# Patient Record
Sex: Male | Born: 1955 | Race: White | Hispanic: No | Marital: Married | Smoking: Former smoker
Health system: Southern US, Community
[De-identification: ages and names within clinical notes are randomized; demographics above are authoritative.]

## PROBLEM LIST (undated history)

## (undated) DIAGNOSIS — IMO0001 Reserved for inherently not codable concepts without codable children: Secondary | ICD-10-CM

## (undated) DIAGNOSIS — Z720 Tobacco use: Secondary | ICD-10-CM

## (undated) DIAGNOSIS — E7800 Pure hypercholesterolemia, unspecified: Secondary | ICD-10-CM

## (undated) DIAGNOSIS — Z8249 Family history of ischemic heart disease and other diseases of the circulatory system: Secondary | ICD-10-CM

## (undated) DIAGNOSIS — I10 Essential (primary) hypertension: Secondary | ICD-10-CM

## (undated) DIAGNOSIS — I2510 Atherosclerotic heart disease of native coronary artery without angina pectoris: Secondary | ICD-10-CM

## (undated) HISTORY — PX: CARDIAC CATHETERIZATION: SHX172

## (undated) HISTORY — DX: Reserved for inherently not codable concepts without codable children: IMO0001

## (undated) HISTORY — DX: Atherosclerotic heart disease of native coronary artery without angina pectoris: I25.10

## (undated) HISTORY — PX: APPENDECTOMY: SHX54

## (undated) HISTORY — DX: Family history of ischemic heart disease and other diseases of the circulatory system: Z82.49

## (undated) HISTORY — PX: ROTATOR CUFF REPAIR: SHX139

## (undated) HISTORY — DX: Tobacco use: Z72.0

## (undated) HISTORY — DX: Pure hypercholesterolemia, unspecified: E78.00

## (undated) HISTORY — DX: Essential (primary) hypertension: I10

## (undated) HISTORY — PX: THYROIDECTOMY: SHX17

---

## 1997-08-26 ENCOUNTER — Ambulatory Visit (HOSPITAL_COMMUNITY): Admission: RE | Admit: 1997-08-26 | Discharge: 1997-08-26 | Payer: Self-pay | Admitting: Neurosurgery

## 2002-07-03 ENCOUNTER — Observation Stay (HOSPITAL_COMMUNITY): Admission: EM | Admit: 2002-07-03 | Discharge: 2002-07-04 | Payer: Self-pay

## 2002-07-03 ENCOUNTER — Encounter: Payer: Self-pay | Admitting: Emergency Medicine

## 2003-10-30 ENCOUNTER — Inpatient Hospital Stay (HOSPITAL_COMMUNITY): Admission: EM | Admit: 2003-10-30 | Discharge: 2003-11-01 | Payer: Self-pay | Admitting: Emergency Medicine

## 2003-10-31 ENCOUNTER — Encounter (INDEPENDENT_AMBULATORY_CARE_PROVIDER_SITE_OTHER): Payer: Self-pay | Admitting: Specialist

## 2003-11-14 ENCOUNTER — Ambulatory Visit (HOSPITAL_COMMUNITY): Admission: RE | Admit: 2003-11-14 | Discharge: 2003-11-14 | Payer: Self-pay | Admitting: Cardiology

## 2005-02-03 IMAGING — CT CT PELVIS W/ CM
1 of 2 series · 15 of 32 positions shown, 19 images · IV contrast (GASTRO & 150 CC OMNI 300)
Comparison: none

CLINICAL DATA: weakness; abdominal pain
 CT SCAN OF THE ABDOMEN WITH CONTRAST
 Spiral scanning is performed after oral administration of diluted contrast and during intravenous administration of 055cc of Omnipaque 300.
 Lung bases are clear.  The liver contains a 1cm cyst at the dome.  There is an indeterminate 1cm low density in the caudal tip of the right lobe.  The gallbladder does not contain any calcified stones.  The spleen and pancreas are normal.  The adrenal glands are normal.  The kidneys are normal.  No retroperitoneal mass or adenopathy.  No aortic aneurysm.  One can appreciate marked thickening of the wall of the transverse colon and splenic flexure.  Segmental involvement of the colon could be seen in ishemic colitis, infectious colitis or inflammatory bowel disease.  The findings are not particularly suggestive of diverticulitis.  
 IMPRESSION
 Segmental thickening of the colon wall from the distal transverse colon to the proximal descending colon.  No sign of gross perforation.  The findings could be due to ischemic, infectious or inflammatory colitis.
 Indeterminate 1cm low density in the caudal segment of the right lobe of the liver.  
 CT SCAN OF THE PELVIS WITH CONTRAST
 Spiral scanning is performed after oral and intravenous contrast administration.  
 No free fluid in the pelvis and no sign of small or large bowel pathology in that region.  The bladder, prostate gland and seminal vesicles appear unremarkable.  No mass or adenopathy.  
 Negative CT scan of the pelvis.

[Series 2: routine abdomen · axial · 0.66mm/px · z∈[-430,-24]mm · 15 of 122 slices shown, 19 images]
[im 9/122  soft-tissue]
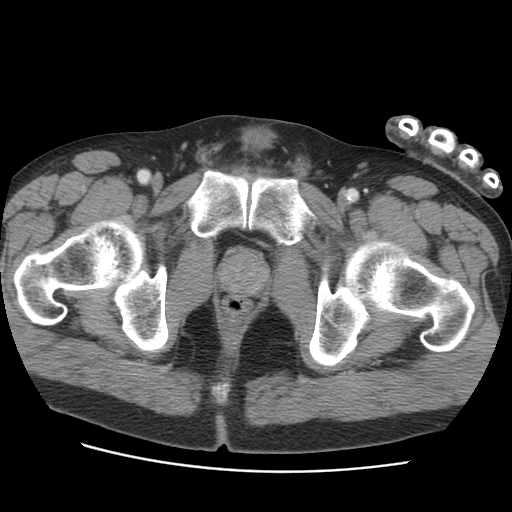
[im 9/122  bone]
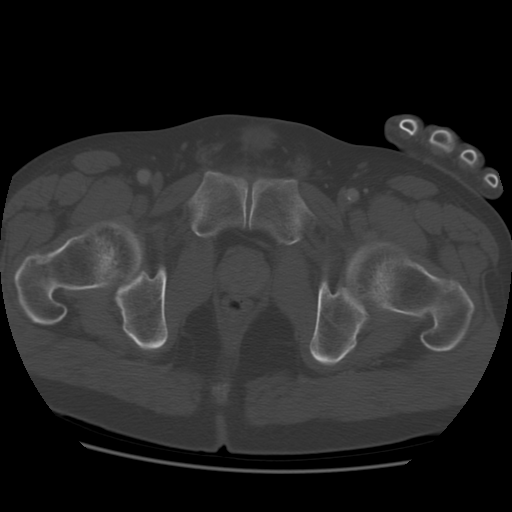
[im 17/122  soft-tissue]
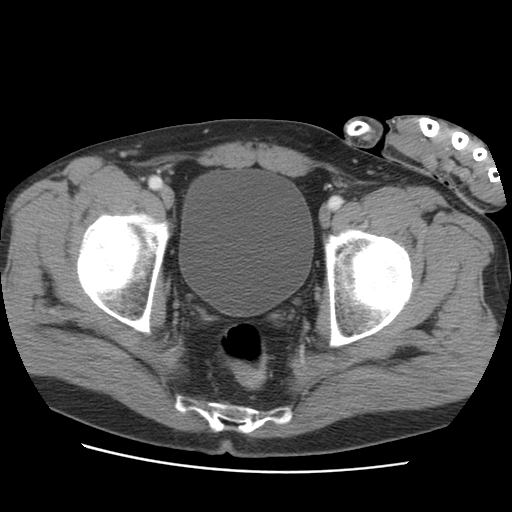
[im 26/122  soft-tissue]
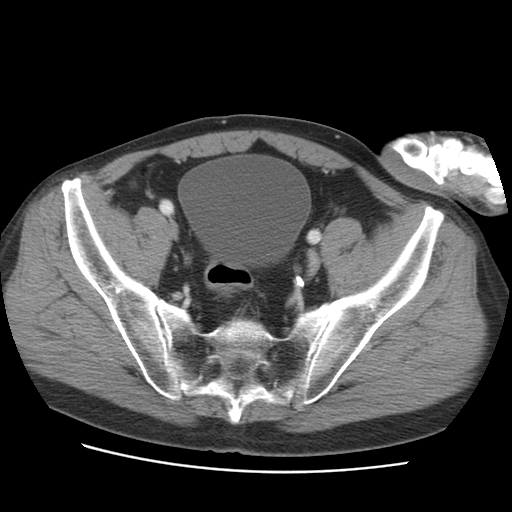
[im 34/122  soft-tissue]
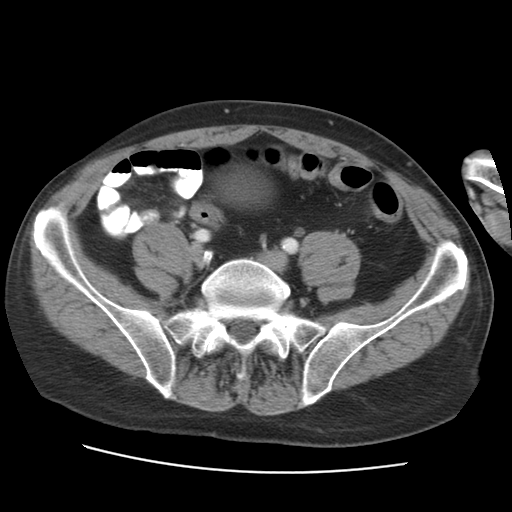
[im 42/122  soft-tissue]
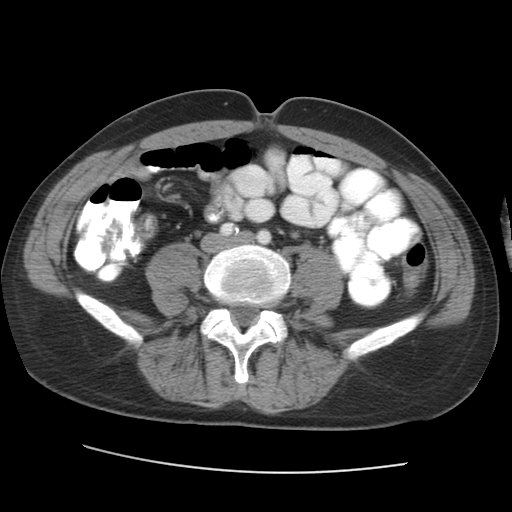
[im 51/122  soft-tissue]
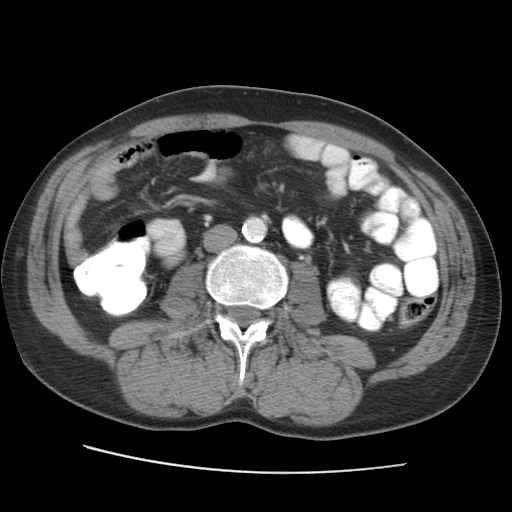
[im 63/122  soft-tissue]
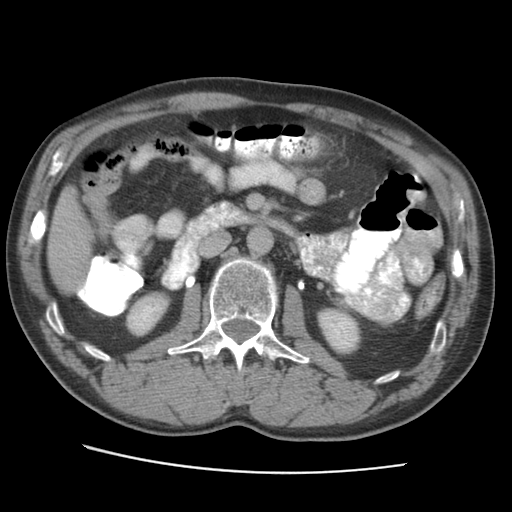
[im 71/122  soft-tissue]
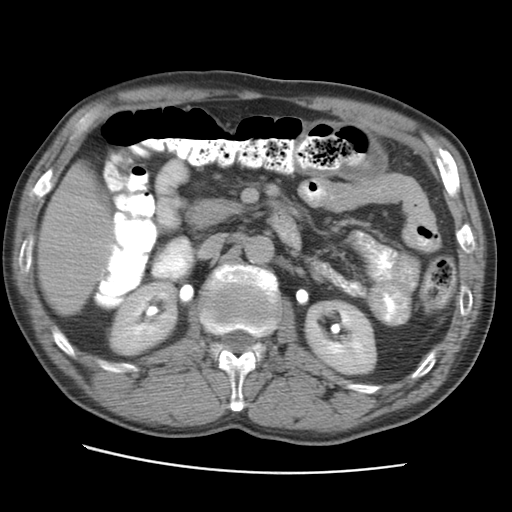
[im 80/122  soft-tissue]
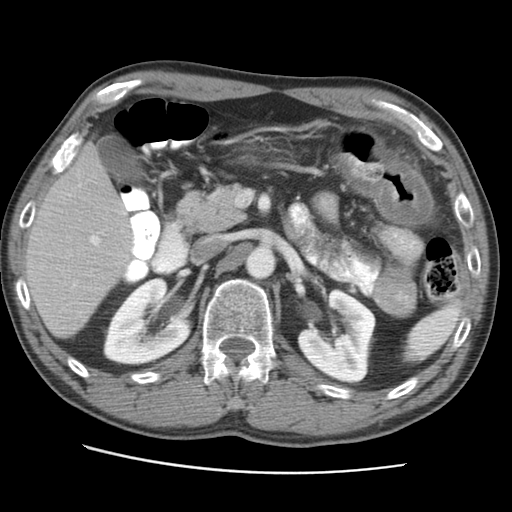
[im 80/122  bone]
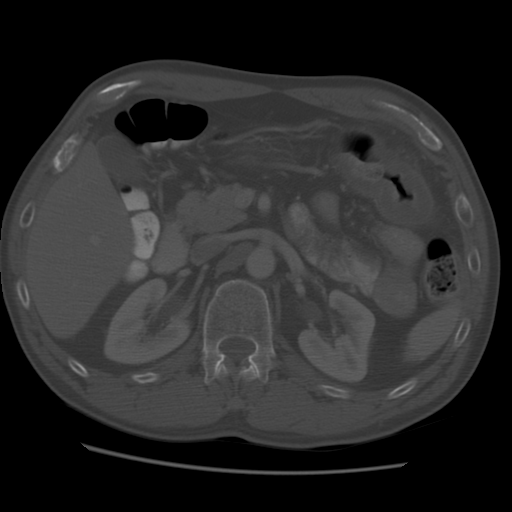
[im 88/122  soft-tissue]
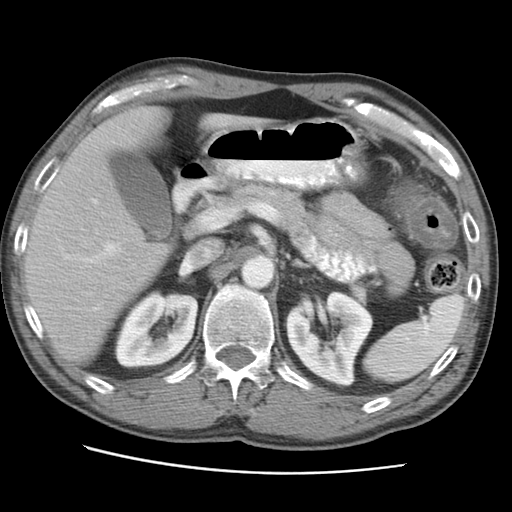
[im 96/122  soft-tissue]
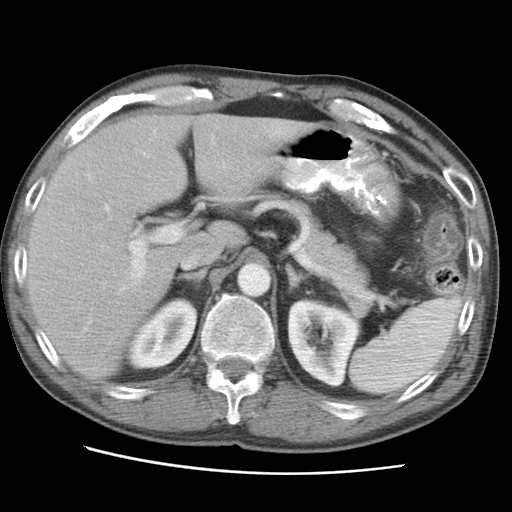
[im 105/122  soft-tissue]
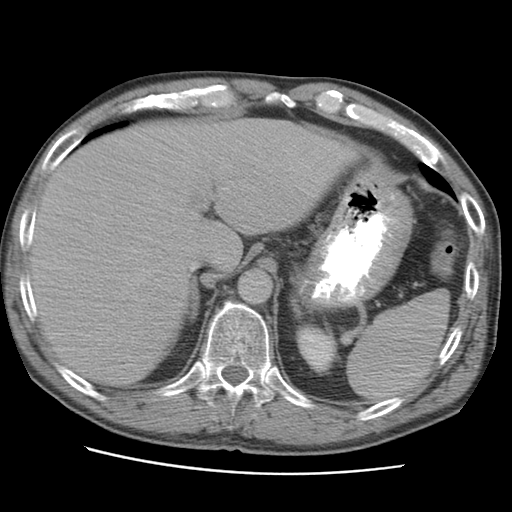
[im 105/122  lung]
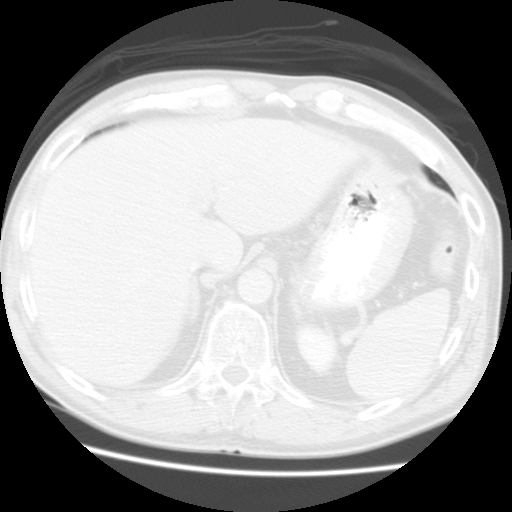
[im 109/122  lung]
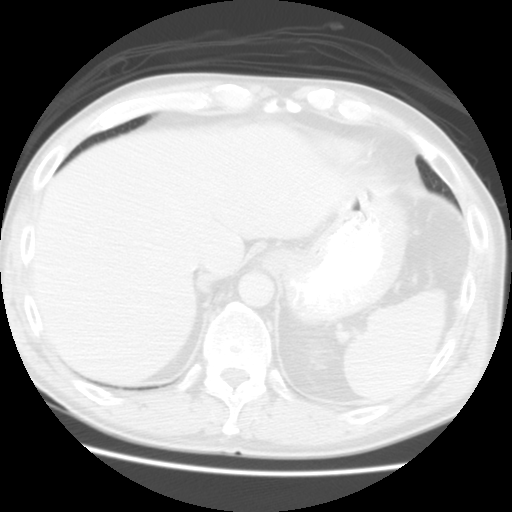
[im 113/122  soft-tissue]
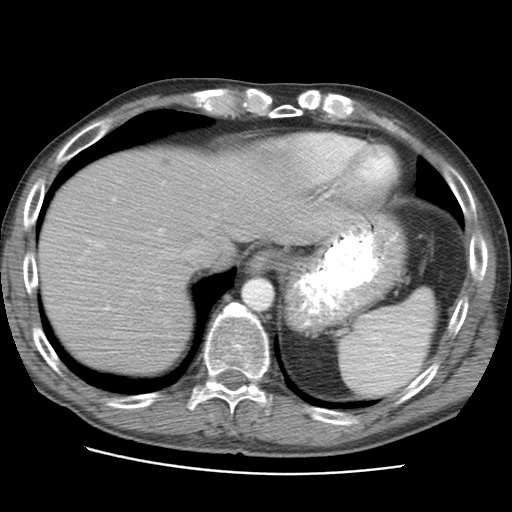
[im 113/122  lung]
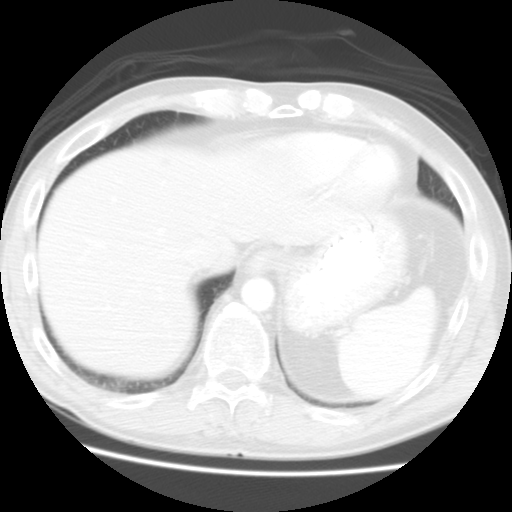
[im 117/122  lung]
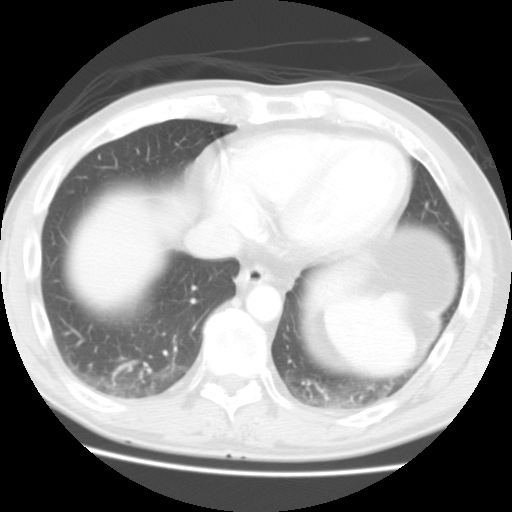

[15 of 32 positions shown; findings below may reference images not displayed]

## 2005-04-04 DIAGNOSIS — IMO0001 Reserved for inherently not codable concepts without codable children: Secondary | ICD-10-CM

## 2005-04-04 HISTORY — DX: Reserved for inherently not codable concepts without codable children: IMO0001

## 2009-12-23 ENCOUNTER — Ambulatory Visit: Payer: Self-pay | Admitting: Cardiology

## 2009-12-23 LAB — LIPID PANEL
Cholesterol: 158 mg/dL (ref 0–200)
HDL: 63 mg/dL (ref 35–70)

## 2009-12-23 LAB — BASIC METABOLIC PANEL
Glucose: 105 mg/dL
Potassium: 5.1 mmol/L (ref 3.4–5.3)

## 2010-05-04 ENCOUNTER — Encounter: Payer: Self-pay | Admitting: Cardiology

## 2010-05-04 DIAGNOSIS — E7800 Pure hypercholesterolemia, unspecified: Secondary | ICD-10-CM | POA: Insufficient documentation

## 2010-05-04 DIAGNOSIS — I10 Essential (primary) hypertension: Secondary | ICD-10-CM | POA: Insufficient documentation

## 2010-05-04 DIAGNOSIS — Z8249 Family history of ischemic heart disease and other diseases of the circulatory system: Secondary | ICD-10-CM | POA: Insufficient documentation

## 2010-05-04 DIAGNOSIS — Z720 Tobacco use: Secondary | ICD-10-CM | POA: Insufficient documentation

## 2010-06-23 ENCOUNTER — Telehealth: Payer: Self-pay | Admitting: *Deleted

## 2010-06-23 ENCOUNTER — Ambulatory Visit: Payer: Self-pay | Admitting: Cardiology

## 2010-06-23 NOTE — Telephone Encounter (Signed)
RN called and left pt voicemail to call back and reschedule missed OV and labs.

## 2010-07-06 ENCOUNTER — Ambulatory Visit (INDEPENDENT_AMBULATORY_CARE_PROVIDER_SITE_OTHER): Payer: BC Managed Care – PPO | Admitting: Cardiology

## 2010-07-06 ENCOUNTER — Encounter: Payer: Self-pay | Admitting: Cardiology

## 2010-07-06 VITALS — BP 128/80 | HR 70 | Ht 72.0 in | Wt 177.4 lb

## 2010-07-06 DIAGNOSIS — Z720 Tobacco use: Secondary | ICD-10-CM

## 2010-07-06 DIAGNOSIS — F17200 Nicotine dependence, unspecified, uncomplicated: Secondary | ICD-10-CM

## 2010-07-06 DIAGNOSIS — E7800 Pure hypercholesterolemia, unspecified: Secondary | ICD-10-CM

## 2010-07-06 DIAGNOSIS — I10 Essential (primary) hypertension: Secondary | ICD-10-CM

## 2010-07-06 DIAGNOSIS — Z8249 Family history of ischemic heart disease and other diseases of the circulatory system: Secondary | ICD-10-CM

## 2010-07-06 NOTE — Assessment & Plan Note (Signed)
Is currently well controlled on Crestor 10 mg per day. Samples were given

## 2010-07-06 NOTE — Progress Notes (Signed)
Samuel Wall comes in today for a followup visit. His hypertension has been satisfactory control but overall he's tolerating Crestor for control of his hypercholesterolemia. Unfortunately, he he continues to smoke cigarettes. He does have a strongly positive family history of heart disease. He had Cardiac catheterization in 1999 with ruptured plaque without recurrence. His last stress study was in 2007. He continues to be active otherwise.   Current Outpatient Prescriptions  Medication Sig Dispense Refill  . aspirin 81 MG tablet Take 81 mg by mouth daily.        . Ranitidine HCl (ZANTAC PO) Take 150 mg by mouth daily.       . rosuvastatin (CRESTOR) 5 MG tablet Take 10 mg by mouth daily.       . valsartan (DIOVAN) 80 MG tablet Take 160 mg by mouth daily.         Allergies  Allergen Reactions  . Valium Other (See Comments)    STOPS HEART!    Patient Active Problem List  Diagnoses  . HTN (hypertension)  . Hypercholesteremia  . Tobacco abuse  . Family history of ischemic heart disease    History  Smoking status  . Current Everyday Smoker -- 1.0 packs/day  . Types: Cigarettes  Smokeless tobacco  . Not on file    History  Alcohol Use     Family History  Problem Relation Age of Onset  . Coronary artery disease Father 49    CABG  . Hypertension Father     Review of Systems:   The patient denies any heat or cold intolerance.  No weight gain or weight loss.  The patient denies headaches or blurry vision.  There is no cough or sputum production.  The patient denies dizziness.  There is no hematuria or hematochezia.  The patient denies any muscle aches or arthritis.  The patient denies any rash.  The patient denies frequent falling or instability.  There is no history of depression or anxiety.  All other systems were reviewed and are negative.   Physical Exam: Weight is 177. Blood pressure is 120/80 sitting, heart rate 70.  The head is normocephalic and atraumatic.  Pupils are equally  round and reactive to light.  Sclerae nonicteric.  Conjunctiva is clear.  Oropharynx is unremarkable.  There's adequate oral airway.  Neck is supple there are no masses.  Thyroid is not enlarged.  There is no lymphadenopathy.  Lungs are clear.  Chest is symmetric.  Heart shows a regular rate and rhythm.  S1 and S2 are normal.  There is no murmur click or gallop.  Abdomen is soft normal bowel sounds.  There is no organomegaly.  Genital and rectal deferred.  Extremities are without edema.  Peripheral pulses are adequate.  Neurologically intact.  Full range of motion.  The patient is not depressed.  Skin is warm and dry.  Assessment / Plan:

## 2010-07-06 NOTE — Assessment & Plan Note (Signed)
Blood pressure is well-controlled. New prescriptions are written.

## 2010-07-06 NOTE — Assessment & Plan Note (Signed)
He continues to smoke. I encouraged him to stop.

## 2010-07-06 NOTE — Assessment & Plan Note (Signed)
He is a strong positive family history of heart disease and had what almost certainly was a plaque rupture in 1999. He continues to smoke away been able to manage other cardiovascular risk factors.

## 2010-07-07 LAB — COMPREHENSIVE METABOLIC PANEL
AST: 17 U/L (ref 0–37)
BUN: 19 mg/dL (ref 6–23)
Calcium: 9.4 mg/dL (ref 8.4–10.5)
Chloride: 103 mEq/L (ref 96–112)
Creatinine, Ser: 1.3 mg/dL (ref 0.4–1.5)

## 2010-07-07 LAB — LIPID PANEL
Cholesterol: 155 mg/dL (ref 0–200)
HDL: 50.1 mg/dL (ref >39.00)
Total CHOL/HDL Ratio: 3
Triglycerides: 91 mg/dL (ref 0.0–149.0)

## 2010-07-08 ENCOUNTER — Telehealth: Payer: Self-pay | Admitting: *Deleted

## 2010-07-12 ENCOUNTER — Telehealth: Payer: Self-pay | Admitting: *Deleted

## 2010-07-12 NOTE — Telephone Encounter (Signed)
Pt notified of lab results and to watch diet, exercise regularly, and lose weight.  Pt will continue same medications.

## 2010-07-12 NOTE — Telephone Encounter (Signed)
Message copied by Barnetta Hammersmith on Mon Jul 12, 2010 11:19 AM ------      Message from: Norma Fredrickson      Created: Wed Jul 07, 2010  9:10 AM       Ok to report. Labs are satisfactory. Watch your sugars. Diet/Exercise/Weight loss is emphasized.       Stay on same medicines. Recheck BMET/HPF/LIPIDS  in 6 months.

## 2010-07-14 NOTE — Telephone Encounter (Signed)
ERROR

## 2010-07-28 ENCOUNTER — Other Ambulatory Visit: Payer: Self-pay | Admitting: *Deleted

## 2010-07-28 DIAGNOSIS — I10 Essential (primary) hypertension: Secondary | ICD-10-CM

## 2010-07-28 MED ORDER — VALSARTAN 160 MG PO TABS: 160.0000 mg | ORAL_TABLET | Freq: Every day | ORAL | Status: DC

## 2010-08-20 NOTE — Discharge Summary (Signed)
Samuel Wall, Samuel Wall                        ACCOUNT NO.:  [id number]   MEDICAL RECORD NO.:  [id number]                   PATIENT TYPE:  INP   LOCATION:  3734                                 FACILITY:  [facility]   PHYSICIAN:  Sherin Quarry, MD                   DATE OF BIRTH:  April 27, 1955   DATE OF ADMISSION:  10/29/2003  DATE OF DISCHARGE:  11/01/2003                                 DISCHARGE SUMMARY   HISTORY:  Samuel Wall is a 55 year old man with a past history of  ischemic heart disease and cigarette smoking.  He initially presented on  October 30, 2003 with a one-day history of abdominal pain associated with  generalized cramping.  He described the pain as extremely severe.  Subsequently he developed diarrhea associated with hematochezia.  He denied  any previous history of abdominal complaints and indicated that he had not  had a colon examination in the past.  A CT scan of the abdomen was done in  the emergency room, which showed segmental thickening of the colon from the  distal transverse colon to the proximal descending colon  -- with no signs  of gross perforation.  These findings were felt to be compatible with  ischemic, infectious or inflammatory colitis.   Other relevant studies obtained in the emergency room included a normal  serum amylase, a normal lipase, a CMET which was normal; a CBC, which  revealed a white count of 11,800 and a normal urinalysis.   In light of these findings, it was felt prudent to admit the patient to the  hospital for further evaluation.  The patient was admitted by Dr. Hollice Espy, M.D.   PHYSICAL EXAMINATION:  On physical examination Dr. Rito Ehrlich noted:  VITAL SIGNS:  A temperature of 98.1, blood pressure 145/95.  Heart rate was  70, respirations 18, O2 saturations 100%.  HEENT:  Examination was within normal limits.  CHEST:  Clear to auscultation and percussion.  CARDIOVASCULAR:  Revealed normal S1, S2; no murmurs, rubs or  gallops.  ABDOMEN:  Described as having normal bowel sounds; it was not distended.  It  showed tenderness to palpation in the right and left lower quadrants;  without guarding or rebound.   HOSPITAL COURSE:  On admission, the patient was placed on an IV of D5 normal  saline at 85 cc/hr.  Imodium was ordered p.r.n. for diarrhea.  The patient  was initially made NPO.  He was empirically placed on Cipro and Flagyl --  the first dose of 400 mg b.i.d., the second dose of 500 mg t.i.d.   An attempt was made to obtain stool specimens, but there are no reports on  these studies to date.  Consultation was obtained from Dr. Ewing Schlein of the  [facility].  A colonoscopy was performed.  Dr. Marlane Hatcher note  indicates that the patient had inflammation of the splenic flexure of  the  colon, which was felt to probably be on an ischemic basis.  He showed no  other abnormalities.   Biopsies were obtained and were pending.  Dr. Ewing Schlein indicated that the  pathology reports would be back by Wednesday or Thursday of next week.  He  recommended that the patient be advanced to a regular diet.  He suggested  empirically continuing antibiotics for another five days, if stool cultures  did not suggest any specific pathogen.  He indicated in his note that an MRA  or CT-A would be a consideration; but, he would suggest waiting for a second  attack before doing that.  He recommended continuing once a day aspirin and  encouraged the patient to have a colonoscopy every five years.   On November 01, 2003 the patient seemed to be doing much better; therefore, was  discharged at that time.   DISCHARGE DIAGNOSES:  1. Generalized inflammation of the splenic flexure of the colon; probably on     ischemic basis.  2. History of coronary artery disease.  3. Hyperlipidemia.   DISCHARGE MEDICATIONS:  1. Diovan one tablet daily.  2. Aspirin 81 mg daily.  3. Zetia 10 mg q.d.  The patient will also be given Cipro 500 mg  b.i.d. for five additional days,  and Flagyl 500 mg t.i.d. for five additional days.   DISCHARGE INSTRUCTIONS:  The patient was counseled not to drink alcohol  while taking Flagyl.  He will continue to follow up on a regular basis with  Dr. Roger Shelter, and Dr. Ewing Schlein will inform him of the results of the  biopsy when this is available.                                                Sherin Quarry, MD    SY/MEDQ  D:  11/01/2003  T:  11/01/2003  Job:  [id number]   cc:   Colleen Can. Deborah Chalk, M.D.  Fax: [phone]   Petra Kuba, M.D.  [address]  [place]  [place] [address]  Fax: [phone]

## 2010-08-20 NOTE — Discharge Summary (Signed)
   NAME:  Samuel Wall, Samuel Wall                        ACCOUNT NO.:  [id number]   MEDICAL RECORD NO.:  [id number]                   PATIENT TYPE:  INP   LOCATION:  4732                                 FACILITY:  [facility]   PHYSICIAN:  Colleen Can. Deborah Chalk, M.D.            DATE OF BIRTH:  03-Apr-1956   DATE OF ADMISSION:  07/03/2002  DATE OF DISCHARGE:  07/04/2002                                 DISCHARGE SUMMARY   PRIMARY DISCHARGE DIAGNOSIS:  Presyncope with subsequent bradycardia,  currently resolved.   SECONDARY DISCHARGE DIAGNOSES:  1. Ischemic heart disease with previous ruptured plaque noted in March of     1999 per cardiac catheterization with moderate irregularities of     coronaries note.  2. Hyperlipidemia, currently off of Lipitor due to myalgias.  3. Past tobacco abuse, currently resolved.   HISTORY OF PRESENT ILLNESS:  The patient is a 55 year old white male who has  known ischemic heart disease.  He presented to the emergency room department  with bradycardia.  He basically did not feel well most of the part of the  day on Wednesday.  He was noted to have significant pallor and  lightheadedness and weakness.  There was some nausea.  He has no chest pain.  He was noted to have a heart rate of 40 by his wife.  He was subsequently  seen, evaluated and admitted for overnight observation.   Please see dictated history and physical for further patient presentation  and profile.   LABORATORY DATA:  Cardiac enzymes were all normal.  CBC normal.  Glucose  slightly elevated at 134, otherwise, chemistries were normal.   Chest x-ray was normal.   EKG showed no acute changes.   HOSPITAL COURSE:  The patient was admitted.  He was monitored overnight on  telemetry.  Heart rate has ranged from anywhere from 52 to 76.  No  significant bradycardia has been identified.  His Cardura has been  discontinued.  He is currently feeling better this morning and is felt to be  a stable candidate for  discharge with outpatient evaluation.   DISCHARGE CONDITION:  Stable.   DISCHARGE MEDICATIONS:  He will stop the Cardura.  He is to continue with  aspirin daily.  We will keep him off Lipitor for now.  We will add Altace  2.5 mg a day to his medical regimen.   SPECIAL DISCHARGE INSTRUCTIONS:  Our office will be calling him to arrange  stress testing time.  He is to call if problems develop in the interim.     Juanell Fairly C. Earl Gala, N.P.                 Colleen Can. Deborah Chalk, M.D.   LCO/MEDQ  D:  07/04/2002  T:  07/05/2002  Job:  [id number]

## 2010-08-20 NOTE — H&P (Signed)
NAME:  Samuel Wall, Samuel Wall                        ACCOUNT NO.:  [id number]   MEDICAL RECORD NO.:  [id number]                   PATIENT TYPE:  INP   LOCATION:  1825                                 FACILITY:  [facility]   PHYSICIAN:  Davene Costain                       DATE OF BIRTH:  Aug 18, 1955   DATE OF ADMISSION:  07/03/2002  DATE OF DISCHARGE:                                HISTORY & PHYSICAL   CHIEF COMPLAINT:  Weakness with presyncope.   HISTORY OF PRESENT ILLNESS:  Mr. Samuel Wall is a 55 year old white male who  has nonischemic heart disease who presents to the emergency department today  with bradycardia.  He notes that since December he had had severe aching of  his muscles.  His stopped his Lipitor over the past 10 days and has almost  had complete resolution of that problem.  He was previously doing well and  in his normal state of health until today when he awoke with a generalized  feeling of weakness which he describes as a sugar attack.  He has had this  in the past, and it subsequently resolved without intervention.  He was able  to go off to work; however, his family members there noted that he was pale,  somewhat gray looking.  He subsequently presented to the emergency room.  His heart rate was in the low 40's.  He has had some nausea in association,  but no chest pain.  He was subsequently admitted for further evaluation.   PAST MEDICAL HISTORY:  1. Ischemic heart disease.  He had a previous cardiac catheterization in     3/99 with normal LV function.  He was felt to have had a probable     ruptured plaque with moderate irregularities noted.  His last nuclear     study was in 2002.  2. Past tobacco abuse.  3. Hyperlipidemia, currently intolerant to Lipitor.   ALLERGIES:  None.   CURRENT MEDICATIONS:  1. Aspirin.  2. He has been off Lipitor for 10 days.   FAMILY HISTORY:  Father has had a history of bypass surgery.  Mother has  arthritis.   SOCIAL HISTORY:  He is  going to be married in August.  He has had no tobacco  products since December.   REVIEW OF SYSTEMS:  As otherwise noted.   PHYSICAL EXAMINATION:  VITAL SIGNS:  Blood pressure is currently 153/96,  heart rate 66, respirations 18.  __________  :  Unremarkable.  LUNGS:  Clear.  HEART:  Regular rhythm.  ABDOMEN:  Soft, positive bowel sounds.  EXTREMITIES:  Without edema.  NEUROLOGIC:  Intact.   LABORATORY DATA:  CBC is normal.  Chemistries are normal.  Glucose is  slightly elevated at 134.  CK and troponin are negative.  EKG shows sinus  bradycardia.   IMPRESSION:  1. Symptomatic bradycardia.  2. Known history of  ischemic heart disease.  3. Hyperlipidemia intolerant to Lipitor.   PLAN:  Will stop his Cardura.  Will watch him overnight on telemetry.  Recheck labs and his old EKG in the morning.     Samuel Wall C. Samuel Wall, N.P.                 Davene Costain    LCO/MEDQ  D:  07/03/2002  T:  07/04/2002  Job:  [id number]   cc:   Davene Costain  [address]  [place]  [place] [address]  Fax: [phone]

## 2010-08-20 NOTE — Consult Note (Signed)
NAME:  Samuel Wall, Samuel Wall                        ACCOUNT NO.:  [id number]   MEDICAL RECORD NO.:  [id number]                   PATIENT TYPE:  INP   LOCATION:  3734                                 FACILITY:  [facility]   PHYSICIAN:  Petra Kuba, M.D.                 DATE OF BIRTH:  [birth year]   DATE OF CONSULTATION:  10/31/2003  DATE OF DISCHARGE:                                   CONSULTATION   HISTORY:  The patient is seen at the request of Dr. Tresa Endo for abnormal CAT  scan, bloody diarrhea, abdominal pain that all started late Tuesday night  and when it continued to get bad on Wednesday, presented to the emergency  room.  He had a CAT scan of his abdomen which showed transverse and  descending colitis and was admitted for further work up and plans.  He was  recently on a cruise about 2 weeks ago, went to the [place], and nobody  else in his group became sick.  He has not been on any antibiotics.  He has  had no previous GI problems.  Bleeding has seemed to stop and his pain seems  to be better.  He has been working quite hard of late but no real strenuous  exercise lately.  He has not had any previous colon tests.   PAST MEDICAL HISTORY:  Pertinent for coronary artery disease and ruptured  plaque, and an myocardial infarction in 1998, and an episode of bradycardia  in 2004.   SOCIAL HISTORY:  He does drink, and does smoke.   MEDICATIONS AT HOME:  Include Diovan, aspirin and Zetia.   ALLERGIES:  VALIUM which was more of increased sedation but no rash,  wheezing or itching.   FAMILY HISTORY:  Negative for any GI problems.   REVIEW OF SYSTEMS:  Negative except above.   PHYSICAL EXAMINATION:  VITAL SIGNS:  See chart, afebrile.  GENERAL:  No acute distress.  ABDOMEN:  Pertinent for being soft and nontender, good bowel sounds.   LABORATORY DATA:  Pertinent for normal chemistries including BUN, creatinine  and liver tests. Amylase, lipase normal.  Coags normal.  White count of  11,000, hemoglobin 16.5, MCV 95, platelets 315,000.  CT scan pertinent for  the colitis as mentioned above but no other abnormalities.   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   PLAN:  Agree with stool studies as ordered although not back in the  computer.  The risks, benefits, methods of flexible  sigmoidoscopy/colonoscopy were scheduled with both the patient and his wife.  Will go ahead and give him half a bottle of magnesium citrate now since he  is better, and go ahead and proceed later today with further work up  and plans pending those findings.  We discussed ischemic colitis, how it can  occur as well as the procedure extensively with both patient and his wife.  Thank you very much for the consultation.                                               Petra Kuba, M.D.    MEM/MEDQ  D:  10/31/2003  T:  10/31/2003  Job:  [id number]

## 2010-08-20 NOTE — H&P (Signed)
NAME:  Samuel Wall, Samuel Wall                        ACCOUNT NO.:  [id number]   MEDICAL RECORD NO.:  [id number]                   PATIENT TYPE:  EMS   LOCATION:  [facility]                                 FACILITY:  [facility]   PHYSICIAN:  Hollice Espy, M.D.            DATE OF BIRTH:  1955-09-06   DATE OF ADMISSION:  10/30/2003  DATE OF DISCHARGE:                                HISTORY & PHYSICAL   PRIMARY CARE PHYSICIAN:  Dr. Colleen Can. Tennant.   CHIEF COMPLAINT:  Abdominal pain.   HISTORY OF PRESENT ILLNESS:  The patient is a 55 year old white male with  past medical history of ischemic heart disease and tobacco abuse, who  presents with a 1-day history of abdominal pain and found to have colitis.  The patient tells me he has been feeling fine over the last few weeks, no  complaints.  He has not been on antibiotics and had had no problems and  then, approximately 24 hours prior to admission, the patient started having  crampy abdominal pain involving the lower right and left quadrants.  He said  it was severe, up to a 10/10, intermittent, relapsing and remitting.  He  also started having problems with diarrhea and was passing bright red bloody  stool.  The patient says previously he has had no problems, just this, and  for these continued symptoms, he became concerned and came into the  emergency room.  He did not report any other complaints of chest pain,  shortness of breath, black tarry stool or other symptoms.  In the emergency  room, the patient was noted to have a normal amylase and lipase level.  His  electrolytes were all within normal limits.  His white count was elevated at  11.8 with a 78% shift.  His hemoglobin and hematocrit were stable as well.  A CT of the abdomen and pelvis was done and cultures were sent off for C.  difficile, ova and parasites and other stool cultures.  The patient's CT was  noted to have segmental colitis involving the distal transverse to the  proximal  ascending.  The patient was given 1 dose of Unasyn; blood cultures  were sent prior to antibiotics being given.  He was given medications for  neck pain and nausea control and is starting to feel a little bit better.  He denies any other complaints of headaches, visual changes, dysphagia,  chest pain, palpitations, shortness of breath, wheeze, cough, hematuria,  dysuria, constipation.  He tells me feels quite fatigued __________  but he  denies any focal extremity weakness.   PAST MEDICAL HISTORY:  1. CAD with history of a ruptured plaque and MI in 1998.  2. He had an episode of bradycardia with a negative workup back in 2004.  3. The patient has a history of ongoing tobacco abuse.   MEDICATIONS:  The patient is on Diovan, aspirin and Zetia.   ALLERGIES:  He is allergic to VALIUM.   SOCIAL HISTORY:  He socially drinks alcohol but not to excess.  He denies  any drug use.  He is an ongoing tobacco user, mostly cigars.   FAMILY HISTORY:  Noncontributory.   PHYSICAL EXAM:  VITALS ON ADMISSION:  Temperature 98.1, blood pressure  145/95, heart rate 70, respirations 18, O2 saturation 100% on room air.  GENERAL:  In general, he appears to be alert and oriented x3 in no apparent  distress.  HEENT:  Normocephalic, atraumatic.  His mucous membranes are dry.  NECK:  He has no carotid bruits.  HEART:  Heart is slightly bradycardic but regular rhythm, S1 and S2.  LUNGS:  Lungs clear to auscultation bilaterally.  ABDOMEN:  Abdomen is soft but some very minimal soreness in the right and  left lower quadrants but for the most part is not very tender.  Abdomen is  nondistended.  He has hyperactive bowel sounds.  EXTREMITIES:  Extremities show no clubbing, cyanosis or edema.  He has no  focal neurological deficits.   LABORATORY WORK:  Amylase 90, lipase 24, sodium 138, potassium 4, chloride  105, bicarb 28, BUN 11, creatinine 1.2, glucose 99.  His LFTs are all within  normal limits.  White count  11.8, hemoglobin and hematocrit 16.5 and 48.2,  MCV of 95, platelet count of 315,000, 78% shift.  PT and INR normal at 12  and 0.8.  Stool has been sent off for C. difficile culture and ova and  parasites.  UA is unremarkable except for 15 of ketones.  PTT of 26.  Blood  cultures are drawn and pending.   CT again shows colitis of the distal transverse and proximal descending.   ASSESSMENT AND PLAN:  1. Colitis with no recent history of recent antibiotic use, to consider     inflammatory bowel disease versus infectious, versus ischemic process.     We will make patient n.p.o., intravenous fluids and pain control plus     p.r.n. Imodium for diarrhea.  The patient received 1 dose of Unasyn in     the emergency room and we will start Cipro and Flagyl.  We will also ask     gastroenterology to see.  2. History of coronary artery disease with a ruptured plaque in 1998.  The     patient is stable for now and we are holding his oral medications of     Zetia and aspirin and Diovan.  3. History of tobacco abuse which is ongoing.                                                Hollice Espy, M.D.    SKK/MEDQ  D:  10/30/2003  T:  10/30/2003  Job:  [id number]   cc:   Colleen Can. Deborah Chalk, M.D.  Fax: [phone]

## 2010-08-20 NOTE — Op Note (Signed)
NAME:  Samuel Wall, Samuel Wall                        ACCOUNT NO.:  [id number]   MEDICAL RECORD NO.:  [id number]                   PATIENT TYPE:  INP   LOCATION:  3734                                 FACILITY:  [facility]   PHYSICIAN:  Petra Kuba, M.D.                 DATE OF BIRTH:  1956/02/21   DATE OF PROCEDURE:  DATE OF DISCHARGE:                                 OPERATIVE REPORT   PROCEDURE PERFORMED:  Colonoscopy with biopsy.   INDICATIONS FOR PROCEDURE:  Normal CT scan, bloody diarrhea, probable  ischemic colitis.  Consent was signed after various methods and options  thoroughly discussed with both the patient and his wife.   MEDICINES USED:  Fentanyl 75 and Versed 3.5.   DESCRIPTION OF PROCEDURE:  Rectal inspection was pertinent for external  hemorrhoids, small. Digital exam was negative.  Video pediatric adjustable  colonoscope was then inserted and easily advanced around the colon to the  cecum.  This did not require any position changes or any abdominal pressure.  The prep was only fair.  There was liquid stool throughout the colon.  On  insertion, ischemic changes of the proximal level of the splenic flexure  were seen.  The cecum was identified by the appendiceal orifice and the  ileocecal valve, in fact the scope was inserted a short ways into the  terminal ileum which was normal, photo documentation was obtained, and the  scope was slowly withdrawn.  We washed and suctioned the majority of the  fluid with approximately 800 to 900 mL of liquid to get adequate  visualization.  The cecum, ascending, and majority of the transverse were  normal.  At about 65 to 55 cm was some erythema, some edema, and some  ulcerations, and a few cold biopsies were obtained.  This did look like  mostly ischemic changes.  The scope was then slowly withdrawn.  The rest of  the left side of the colon was normal.  Anorectal pull-through and  retroflexion did reveal some small hemorrhoids.  The scope  was reinserted a  short ways in the __________ side of the colon, air was suctioned, and scope  removed.  The patient tolerated the procedure well.  There was no obvious  immediate complication.   ENDOSCOPIC DIAGNOSES:  1. Internal and external hemorrhoids.  2. Splenic flexure inflammation, probably ischemic, status post biopsy.  3. Otherwise within normal limits to the terminal ileum.   PLAN:  1. Will await pathology.  2. Slowly advance diet tomorrow if okay.  3. Stop antibiotics in a few days if the stool cultures, O&P, etc., are     negative.  4. Happy to see back p.r.n.  5. Might consider a CTA or an MRA, but happy to wait for the second attack     that could lead to Dr. Deborah Chalk based     on degree of other vascular elsewhere.  6. Otherwise,  agree with continuing his once a day aspirin and repeat colon     screening in five years if doing well medically.                                               Petra Kuba, M.D.    MEM/MEDQ  D:  10/31/2003  T:  11/01/2003  Job:  [id number]   cc:   Colleen Can. Deborah Chalk, M.D.  Fax: [phone]   Sherin Quarry, MD

## 2010-10-13 ENCOUNTER — Encounter: Payer: Self-pay | Admitting: Cardiology

## 2010-10-13 ENCOUNTER — Encounter: Payer: Self-pay | Admitting: Nurse Practitioner

## 2011-01-06 ENCOUNTER — Encounter: Payer: Self-pay | Admitting: Cardiology

## 2011-01-07 ENCOUNTER — Telehealth: Payer: Self-pay | Admitting: Cardiology

## 2011-01-07 ENCOUNTER — Other Ambulatory Visit: Payer: Self-pay | Admitting: *Deleted

## 2011-01-07 DIAGNOSIS — E7800 Pure hypercholesterolemia, unspecified: Secondary | ICD-10-CM

## 2011-01-07 NOTE — Telephone Encounter (Signed)
Pt need an order for lab work to be set up in the system.

## 2011-01-12 ENCOUNTER — Other Ambulatory Visit: Payer: BC Managed Care – PPO | Admitting: *Deleted

## 2011-01-12 ENCOUNTER — Encounter: Payer: Self-pay | Admitting: Nurse Practitioner

## 2011-01-12 ENCOUNTER — Ambulatory Visit (INDEPENDENT_AMBULATORY_CARE_PROVIDER_SITE_OTHER): Payer: BC Managed Care – PPO | Admitting: Nurse Practitioner

## 2011-01-12 VITALS — BP 138/98 | HR 72 | Ht 71.0 in | Wt 188.0 lb

## 2011-01-12 DIAGNOSIS — Z720 Tobacco use: Secondary | ICD-10-CM

## 2011-01-12 DIAGNOSIS — E7800 Pure hypercholesterolemia, unspecified: Secondary | ICD-10-CM

## 2011-01-12 DIAGNOSIS — E785 Hyperlipidemia, unspecified: Secondary | ICD-10-CM

## 2011-01-12 DIAGNOSIS — I10 Essential (primary) hypertension: Secondary | ICD-10-CM

## 2011-01-12 DIAGNOSIS — I2510 Atherosclerotic heart disease of native coronary artery without angina pectoris: Secondary | ICD-10-CM | POA: Insufficient documentation

## 2011-01-12 DIAGNOSIS — F17200 Nicotine dependence, unspecified, uncomplicated: Secondary | ICD-10-CM

## 2011-01-12 LAB — BASIC METABOLIC PANEL
BUN: 17 mg/dL (ref 6–23)
CO2: 27 mEq/L (ref 19–32)
Calcium: 8.4 mg/dL (ref 8.4–10.5)
Chloride: 105 mEq/L (ref 96–112)
Creatinine, Ser: 1.2 mg/dL (ref 0.4–1.5)
GFR: 66.84 mL/min (ref >60.00)
Glucose, Bld: 96 mg/dL (ref 70–99)
Potassium: 4 mEq/L (ref 3.5–5.1)
Sodium: 139 mEq/L (ref 135–145)

## 2011-01-12 LAB — HEPATIC FUNCTION PANEL
ALT: 18 U/L (ref 0–53)
AST: 18 U/L (ref 0–37)
Albumin: 4.1 g/dL (ref 3.5–5.2)
Alkaline Phosphatase: 56 U/L (ref 39–117)
Bilirubin, Direct: 0.1 mg/dL (ref 0.0–0.3)
Total Bilirubin: 0.4 mg/dL (ref 0.3–1.2)
Total Protein: 7 g/dL (ref 6.0–8.3)

## 2011-01-12 LAB — LIPID PANEL
Cholesterol: 132 mg/dL (ref 0–200)
HDL: 51.8 mg/dL (ref >39.00)
LDL Cholesterol: 68 mg/dL (ref 0–99)
Total CHOL/HDL Ratio: 3
Triglycerides: 63 mg/dL (ref 0.0–149.0)
VLDL: 12.6 mg/dL (ref 0.0–40.0)

## 2011-01-12 NOTE — Assessment & Plan Note (Signed)
He has had a remote ruptured plaque back in 1999. Last nuclear was in 2007. He is agreeable to repeating. No current symptoms. He is encouraged to stop smoking. We will tentatively see him back in 6 months. Patient is agreeable to this plan and will call if any problems develop in the interim.

## 2011-01-12 NOTE — Progress Notes (Signed)
    Samuel Wall Date of Birth: 05/02/1955   History of Present Illness: Samuel Wall is seen today for his 6 month visit. He is seen for Dr. Elease Hashimoto. He is a former patient of Dr. Ronnald Nian. He continues to do well from a cardiac standpoint. He is not having chest pain. Energy level is ok. He does continue to smoke. Last stress test was in 2007. He has had a prior ruptured plaque in 1999 and had mild to moderate disease in the RCA. His family history is quite strong for CAD.   Current Outpatient Prescriptions on File Prior to Visit  Medication Sig Dispense Refill  . aspirin 81 MG tablet Take 162 mg by mouth daily.       . Ranitidine HCl (ZANTAC PO) Take 150 mg by mouth daily.       . rosuvastatin (CRESTOR) 5 MG tablet Take 10 mg by mouth daily.       . valsartan (DIOVAN) 160 MG tablet Take 1 tablet (160 mg total) by mouth daily.  30 tablet  6    Allergies  Allergen Reactions  . Valium Other (See Comments)    STOPS HEART!    Past Medical History  Diagnosis Date  . HTN (hypertension)   . Hypercholesteremia   . Tobacco abuse   . Family history of ischemic heart disease   . CAD (coronary artery disease)     history of ruptured plaque in 1999 without recurrence  . Normal nuclear stress test 2007    Past Surgical History  Procedure Date  . Cardiac catheterization   . Thyroidectomy   . Appendectomy     History  Smoking status  . Current Everyday Smoker -- 1.0 packs/day  . Types: Cigarettes  Smokeless tobacco  . Not on file    History  Alcohol Use No    Family History  Problem Relation Age of Onset  . Coronary artery disease Father 30    CABG  . Hypertension Father   . Heart disease Father     Review of Systems: The review of systems is per the HPI.  Blood pressure has been good at home. No cardiac complaints. He has had a recent URI that is resolved.  All other systems were reviewed and are negative.  Physical Exam: BP 138/98  Pulse 72  Ht 5\' 11"  (1.803 m)  Wt  188 lb (85.276 kg)  BMI 26.22 kg/m2 Patient is very pleasant and in no acute distress. Skin is warm and dry. Color is normal.  HEENT is unremarkable. Normocephalic/atraumatic. PERRL. Sclera are nonicteric. Neck is supple. No masses. No JVD. Lungs are clear. Cardiac exam shows a regular rate and rhythm. Abdomen is soft. Extremities are without edema. Gait and ROM are intact. No gross neurologic deficits noted.   LABORATORY DATA: PENDING   Assessment / Plan:

## 2011-01-12 NOTE — Assessment & Plan Note (Signed)
Cessation is once again encouraged.

## 2011-01-12 NOTE — Patient Instructions (Addendum)
Smoking cessation is encouraged.   We are going to arrange for a repeat stress test  Stay active.   We will see you back in 6 months unless your stress test is abnormal. You will see Dr. Delane Ginger at your next visit.

## 2011-01-12 NOTE — Assessment & Plan Note (Signed)
Blood pressure is ok at home. No change with his medicines.

## 2011-01-12 NOTE — Assessment & Plan Note (Signed)
Labs are checked today.  

## 2011-01-19 ENCOUNTER — Telehealth: Payer: Self-pay | Admitting: *Deleted

## 2011-01-19 NOTE — Telephone Encounter (Signed)
Left message to call.

## 2011-01-19 NOTE — Telephone Encounter (Signed)
Message copied by Eugenia Pancoast on Wed Jan 19, 2011  9:17 AM ------      Message from: Rosalio Macadamia      Created: Thu Jan 13, 2011  7:30 AM       Ok to report. Labs are very good!  Stay on same medicines. Recheck BMET/HPF/LIPIDS  in 6 months.

## 2011-01-20 ENCOUNTER — Encounter: Payer: Self-pay | Admitting: *Deleted

## 2011-01-26 ENCOUNTER — Ambulatory Visit (HOSPITAL_COMMUNITY): Payer: BC Managed Care – PPO | Attending: Cardiovascular Disease | Admitting: Radiology

## 2011-01-26 DIAGNOSIS — Z720 Tobacco use: Secondary | ICD-10-CM

## 2011-01-26 DIAGNOSIS — R0989 Other specified symptoms and signs involving the circulatory and respiratory systems: Secondary | ICD-10-CM

## 2011-01-26 DIAGNOSIS — R0602 Shortness of breath: Secondary | ICD-10-CM

## 2011-01-26 DIAGNOSIS — F17200 Nicotine dependence, unspecified, uncomplicated: Secondary | ICD-10-CM | POA: Insufficient documentation

## 2011-01-26 DIAGNOSIS — I2510 Atherosclerotic heart disease of native coronary artery without angina pectoris: Secondary | ICD-10-CM | POA: Insufficient documentation

## 2011-01-26 DIAGNOSIS — E785 Hyperlipidemia, unspecified: Secondary | ICD-10-CM | POA: Insufficient documentation

## 2011-01-26 MED ORDER — TECHNETIUM TC 99M TETROFOSMIN IV KIT
11.0000 | PACK | Freq: Once | INTRAVENOUS | Status: AC | PRN
  Administered 2011-01-26: 11 via INTRAVENOUS

## 2011-01-26 MED ORDER — TECHNETIUM TC 99M TETROFOSMIN IV KIT
33.0000 | PACK | Freq: Once | INTRAVENOUS | Status: AC | PRN
  Administered 2011-01-26: 33 via INTRAVENOUS

## 2011-01-26 NOTE — Progress Notes (Signed)
[facility] SITE 3 NUCLEAR MED [address] [place] [address] [phone]  Cardiology Nuclear Med Samuel Wall is a 55 y.o. male [id number] 08/23/1955   Nuclear Med Background Indication for Stress Test:  Evaluation for Ischemia History: 1999 Heart Catheterization: ruptured plaque mod dz in RCA med Tx and '07 Myocardial Perfusion Study: EF 61% NL Cardiac Risk Factors: Family History - CAD, Hypertension, Lipids and Smoker  Symptoms:  DOE and Palpitations   Nuclear Pre-Procedure Caffeine/Decaff Intake:  None NPO After: 8:00pm   Lungs:  clear IV 0.9% NS with Angio Cath:  20g  IV Site: R Hand x 1, tolerated well  IV Started by:  Irean Hong, RN  Chest Size (in):  42 Cup Size: n/a  Height: 5\' 11"  (1.803 m)  Weight:  178 lb (80.74 kg)  BMI:  Body mass index is 24.83 kg/(m^2). Tech Comments:  N/A    Nuclear Med Study 1 or 2 day study: 1 day  Stress Test Type:  Stress  Reading MD: Willa Rough, MD  Order Authorizing Provider:  Kristeen Miss, MD, Norma Fredrickson, NP  Resting Radionuclide: Technetium 28m Tetrofosmin  Resting Radionuclide Dose: 11.0 mCi   Stress Radionuclide:  Technetium 47m Tetrofosmin  Stress Radionuclide Dose: 33.0 mCi           Stress Protocol Rest HR: 52 Stress HR: 146  Rest BP: 109/66 Stress BP: 193/83  Exercise Time (min): 9:45 METS: 11.3   Predicted Max HR: 163 bpm % Max HR: 89.57 bpm Rate Pressure Product: 57846   Dose of Adenosine (mg):  n/a Dose of Lexiscan: n/a mg  Dose of Atropine (mg): n/a Dose of Dobutamine: n/a mcg/kg/min (at max HR)  Stress Test Technologist: Milana Na, EMT-P  Nuclear Technologist:  Domenic Polite, CNMT     Rest Procedure:  Myocardial perfusion imaging was performed at rest 45 minutes following the intravenous administration of Technetium 85m Tetrofosmin. Rest ECG: Sinus Brady/Early Repolarization  Stress Procedure:  The patient exercised for 9:45.  The patient stopped due to  fatigue and denied any chest pain.  There were non specific ST-T wave changes and rare pvcs/pacs.  Technetium 39m Tetrofosmin was injected at peak exercise and myocardial perfusion imaging was performed after a brief delay. Stress ECG: No significant change from baseline ECG  QPS Raw Data Images:  Patient motion noted; appropriate software correction applied. Stress Images:  Normal homogeneous uptake in all areas of the myocardium. Rest Images:  Normal homogeneous uptake in all areas of the myocardium. Subtraction (SDS):  No evidence of ischemia. Transient Ischemic Dilatation (Normal <1.22):  0.96 Lung/Heart Ratio (Normal <0.45):  0.32  Quantitative Gated Spect Images QGS EDV:  89 ml QGS ESV:  33 ml QGS cine images:  Normal Wall Motion QGS EF: 63%  Impression Exercise Capacity:  Good exercise capacity. BP Response:  Normal blood pressure response. Clinical Symptoms:  No chest pain. ECG Impression:  No significant ST segment change suggestive of ischemia. Comparison with Prior Nuclear Study: No significant change from previous study (2007)  Overall Impression:  Normal stress nuclear study.   Willa Rough

## 2011-11-22 ENCOUNTER — Ambulatory Visit (INDEPENDENT_AMBULATORY_CARE_PROVIDER_SITE_OTHER): Payer: BC Managed Care – PPO | Admitting: Cardiovascular Disease

## 2011-11-22 ENCOUNTER — Encounter: Payer: Self-pay | Admitting: Cardiovascular Disease

## 2011-11-22 VITALS — BP 132/84 | HR 61 | Ht 71.0 in | Wt 180.8 lb

## 2011-11-22 DIAGNOSIS — F17200 Nicotine dependence, unspecified, uncomplicated: Secondary | ICD-10-CM

## 2011-11-22 DIAGNOSIS — I2510 Atherosclerotic heart disease of native coronary artery without angina pectoris: Secondary | ICD-10-CM

## 2011-11-22 DIAGNOSIS — Z720 Tobacco use: Secondary | ICD-10-CM

## 2011-11-22 DIAGNOSIS — I10 Essential (primary) hypertension: Secondary | ICD-10-CM

## 2011-11-22 LAB — LIPID PANEL: VLDL: 19 mg/dL (ref 0.0–40.0)

## 2011-11-22 LAB — BASIC METABOLIC PANEL
BUN: 14 mg/dL (ref 6–23)
Calcium: 9.2 mg/dL (ref 8.4–10.5)
GFR: 68.63 mL/min (ref >60.00)
Glucose, Bld: 99 mg/dL (ref 70–99)

## 2011-11-22 LAB — TSH: TSH: 0.48 u[IU]/mL (ref 0.35–5.50)

## 2011-11-22 LAB — HEPATIC FUNCTION PANEL
AST: 17 U/L (ref 0–37)
Alkaline Phosphatase: 51 U/L (ref 39–117)
Total Bilirubin: 0.4 mg/dL (ref 0.3–1.2)

## 2011-11-22 MED ORDER — ROSUVASTATIN CALCIUM 10 MG PO TABS: 10.0000 mg | ORAL_TABLET | Freq: Every day | ORAL | Status: DC

## 2011-11-22 MED ORDER — VALSARTAN 160 MG PO TABS: 160.0000 mg | ORAL_TABLET | Freq: Two times a day (BID) | ORAL | Status: DC

## 2011-11-22 NOTE — Progress Notes (Signed)
    [name] Date of Birth  04/11/1955       [facility] Office [address]  [address] [place], [place]  [address]   Samuel Wall, [place]  [address] [phone]     [phone]   Fax  [phone]    Fax [phone]  Problem List: 1. Coronary artery disease 2. Hypertension 3. Hyperlipidemia 4. Hypothyroidectomy  History of Present Illness:  Samuel Wall is a 56 yo with hx of CAD in 1998.  He had a plaque rupture but did not require any PCI.   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  He has not had any chest pain since 2005.  He is very active. He denies any chest pain. He had multiple stress tests in the past that are negative.  Current Outpatient Prescriptions on File Prior to Visit  Medication Sig Dispense Refill  . aspirin 81 MG tablet Take 162 mg by mouth daily.       . Ranitidine HCl (ZANTAC PO) Take 150 mg by mouth daily.       . rosuvastatin (CRESTOR) 5 MG tablet Take 10 mg by mouth daily.         Allergies  Allergen Reactions  . Valium Other (See Comments)    STOPS HEART!    Past Medical History  Diagnosis Date  . HTN (hypertension)   . Hypercholesteremia   . Tobacco abuse   . Family history of ischemic heart disease   . CAD (coronary artery disease)     history of ruptured plaque in 1999 without recurrence  . Normal nuclear stress test 2007    Past Surgical History  Procedure Date  . Cardiac catheterization   . Thyroidectomy   . Appendectomy     History  Smoking status  . Current Everyday Smoker -- 1.0 packs/day  . Types: Cigarettes  Smokeless tobacco  . Not on file    History  Alcohol Use No    Family History  Problem Relation Age of Onset  . Coronary artery disease Father 80    CABG  . Hypertension Father   . Heart disease Father     Reviw of Systems:  Reviewed in the HPI.  All other systems are  negative.  Physical Exam: Blood pressure 147/101, pulse 61, height 5\' 11"  (1.803 m), weight 180 lb 12.8 oz (82.01 kg). General: Well developed, well nourished, in no acute distress.  Head: Normocephalic, atraumatic, sclera non-icteric, mucus membranes are moist,   Neck: Supple. Carotids are 2 + without bruits. No JVD  Lungs: Clear bilaterally to auscultation.  Heart: regular rate.  normal  S1 S2. No murmurs, gallops or rubs.  Abdomen: Soft, non-tender, non-distended with normal bowel sounds. No hepatomegaly. No rebound/guarding. No masses.  Msk:  Strength and tone are normal  Extremities: No clubbing or cyanosis. No edema.  Distal pedal pulses are 2+ and equal bilaterally.  Neuro: Alert and oriented X 3. Moves all extremities spontaneously.  Psych:  Responds to questions appropriately with a normal affect.  ECG: 11/22/2011-sinus bradycardia at 56 beats a minute. The EKG is otherwise normal.  Assessment / Plan:

## 2011-11-22 NOTE — Assessment & Plan Note (Signed)
BP is slightly elevated.  We discussed salt restrictions.  DASH diet.

## 2011-11-22 NOTE — Assessment & Plan Note (Signed)
He continues to smoke. We discussed trying electronic cigarette or nicotine inhaler. He'll continue to work on this.

## 2011-11-22 NOTE — Patient Instructions (Addendum)
Your physician wants you to follow-up in: 6 MONTHS  You will receive a reminder letter in the mail two months in advance. If you don't receive a letter, please call our office to schedule the follow-up appointment.  Your physician recommends that you return for a FASTING lipid profile: TODAY AND IN 6 MONTHS    REDUCE HIGH SODIUM FOODS LIKE CANNED SOUP, GRAVY, SAUCES, READY PREPARED FOODS LIKE FROZEN FOODS; LEAN CUISINE, LASAGNA. BACON, SAUSAGE, LUNCH MEAT, FAST FOODS.Marland Kitchen   DASH Diet The DASH diet stands for "Dietary Approaches to Stop Hypertension." It is a healthy eating plan that has been shown to reduce high blood pressure (hypertension) in as little as 14 days, while also possibly providing other significant health benefits. These other health benefits include reducing the risk of breast cancer after menopause and reducing the risk of type 2 diabetes, heart disease, colon cancer, and stroke. Health benefits also include weight loss and slowing kidney failure in patients with chronic kidney disease.  DIET GUIDELINES  Limit salt (sodium). Your diet should contain less than 1500 mg of sodium daily.   Limit refined or processed carbohydrates. Your diet should include mostly whole grains. Desserts and added sugars should be used sparingly.   Include small amounts of heart-healthy fats. These types of fats include nuts, oils, and tub margarine. Limit saturated and trans fats. These fats have been shown to be harmful in the body.  CHOOSING FOODS  The following food groups are based on a 2000 calorie diet. See your Registered Dietitian for individual calorie needs. Grains and Grain Products (6 to 8 servings daily)  Eat More Often: Whole-wheat bread, brown rice, whole-grain or wheat pasta, quinoa, popcorn without added fat or salt (air popped).   Eat Less Often: White bread, white pasta, white rice, cornbread.  Vegetables (4 to 5 servings daily)  Eat More Often: Fresh, frozen, and canned  vegetables. Vegetables may be raw, steamed, roasted, or grilled with a minimal amount of fat.   Eat Less Often/Avoid: Creamed or fried vegetables. Vegetables in a cheese sauce.  Fruit (4 to 5 servings daily)  Eat More Often: All fresh, canned (in natural juice), or frozen fruits. Dried fruits without added sugar. One hundred percent fruit juice ( cup [237 mL] daily).   Eat Less Often: Dried fruits with added sugar. Canned fruit in light or heavy syrup.  [employer], Fish, and Poultry (2 servings or less daily. One serving is 3 to 4 oz [85-114 g]).  Eat More Often: Ninety percent or leaner ground beef, tenderloin, sirloin. Round cuts of beef, chicken breast, [place] breast. All fish. Grill, bake, or broil your meat. Nothing should be fried.   Eat Less Often/Avoid: Fatty cuts of meat, [place], or chicken leg, thigh, or wing. Fried cuts of meat or fish.  Dairy (2 to 3 servings)  Eat More Often: Low-fat or fat-free milk, low-fat plain or light yogurt, reduced-fat or part-skim cheese.   Eat Less Often/Avoid: Milk (whole, 2%, skim, or chocolate).Whole milk yogurt. Full-fat cheeses.  Nuts, Seeds, and Legumes (4 to 5 servings per week)  Eat More Often: All without added salt.   Eat Less Often/Avoid: Salted nuts and seeds, canned beans with added salt.  Fats and Sweets (limited)  Eat More Often: Vegetable oils, tub margarines without trans fats, sugar-free gelatin. Mayonnaise and salad dressings.   Eat Less Often/Avoid: Coconut oils, palm oils, butter, stick margarine, cream, half and half, cookies, candy, pie.  FOR MORE INFORMATION The Dash Diet Eating Plan:  www.dashdiet.org Document Released: 03/10/2011 Document Reviewed: 02/28/2011 [facility] Patient Information 2012 [place], [place].

## 2011-11-22 NOTE — Assessment & Plan Note (Signed)
She has a history of coronary disease and ruptured plaque many years ago. He's not required any PCI. He's had a negative stress test as a pleasure.  We'll continue the same medications. I've encouraged him to continue to exercise on regular basis.

## 2011-11-29 ENCOUNTER — Telehealth: Payer: Self-pay | Admitting: Cardiovascular Disease

## 2011-11-29 NOTE — Telephone Encounter (Signed)
Sent!

## 2011-11-29 NOTE — Telephone Encounter (Signed)
Raynelle Fanning from [facility] called wanting to have Pre Authorization for Patients Crestor.

## 2011-11-29 NOTE — Telephone Encounter (Signed)
[facility] [phone] please return call to [facility] [phone], regarding patient crestor authorization.

## 2012-10-02 ENCOUNTER — Encounter: Payer: Self-pay | Admitting: Cardiology

## 2012-10-03 ENCOUNTER — Encounter: Payer: Self-pay | Admitting: Cardiology

## 2014-03-17 ENCOUNTER — Encounter: Payer: Self-pay | Admitting: Cardiovascular Disease

## 2015-01-21 DIAGNOSIS — F17200 Nicotine dependence, unspecified, uncomplicated: Secondary | ICD-10-CM | POA: Insufficient documentation

## 2017-03-01 DIAGNOSIS — E785 Hyperlipidemia, unspecified: Secondary | ICD-10-CM | POA: Insufficient documentation

## 2019-04-25 ENCOUNTER — Ambulatory Visit: Payer: Medicare HMO | Attending: Internal Medicine

## 2019-04-25 DIAGNOSIS — Z23 Encounter for immunization: Secondary | ICD-10-CM | POA: Insufficient documentation

## 2019-04-25 NOTE — Progress Notes (Signed)
   Covid-19 Vaccination Clinic  Name:  Samuel Wall    MRN: [id number] DOB: [birth year]  04/25/2019  [name] was observed post Covid-19 immunization for 15 minutes  without incidence. He was provided with Vaccine Information Sheet and instruction to access the V-Safe system.   [name] was instructed to call 911 with any severe reactions post vaccine: Marland Kitchen Difficulty breathing  . Swelling of your face and throat  . A fast heartbeat  . A bad rash all over your body  . Dizziness and weakness    Immunizations Administered    Name Date Dose VIS Date Route   Pfizer COVID-19 Vaccine 04/25/2019  3:38 PM 0.3 mL 03/15/2019 Intramuscular   Manufacturer: [employer], [employer]   Lot: [id number]   NDC: [address]-9

## 2019-05-16 ENCOUNTER — Ambulatory Visit: Payer: Medicare HMO | Attending: Internal Medicine

## 2019-05-16 DIAGNOSIS — Z23 Encounter for immunization: Secondary | ICD-10-CM

## 2019-05-16 NOTE — Progress Notes (Signed)
   Covid-19 Vaccination Clinic  Name:  HOVANES HYMAS    MRN: [id number] DOB: [birth year]  05/16/2019  Mr. Maves was observed post Covid-19 immunization for 15 minutes without incidence. He was provided with Vaccine Information Sheet and instruction to access the V-Safe system.   Mr. Jeanbaptiste was instructed to call 911 with any severe reactions post vaccine: Marland Kitchen Difficulty breathing  . Swelling of your face and throat  . A fast heartbeat  . A bad rash all over your body  . Dizziness and weakness    Immunizations Administered    Name Date Dose VIS Date Route   Pfizer COVID-19 Vaccine 05/16/2019  4:31 PM 0.3 mL 03/15/2019 Intramuscular   Manufacturer: [employer], [employer]   Lot: [id number]   NDC: [address]-3

## 2023-09-02 ENCOUNTER — Encounter (HOSPITAL_BASED_OUTPATIENT_CLINIC_OR_DEPARTMENT_OTHER): Payer: Self-pay | Admitting: Emergency Medicine

## 2023-09-02 ENCOUNTER — Inpatient Hospital Stay (HOSPITAL_BASED_OUTPATIENT_CLINIC_OR_DEPARTMENT_OTHER)
Admission: EM | Admit: 2023-09-02 | Discharge: 2023-09-07 | DRG: 419 | Disposition: A | Discharge status: Home / Self Care | Attending: Family Medicine | Admitting: Family Medicine

## 2023-09-02 ENCOUNTER — Other Ambulatory Visit: Payer: Self-pay

## 2023-09-02 DIAGNOSIS — Z79899 Other long term (current) drug therapy: Secondary | ICD-10-CM

## 2023-09-02 DIAGNOSIS — Z951 Presence of aortocoronary bypass graft: Secondary | ICD-10-CM

## 2023-09-02 DIAGNOSIS — E890 Postprocedural hypothyroidism: Secondary | ICD-10-CM | POA: Diagnosis present

## 2023-09-02 DIAGNOSIS — Z7902 Long term (current) use of antithrombotics/antiplatelets: Secondary | ICD-10-CM

## 2023-09-02 DIAGNOSIS — Z79890 Hormone replacement therapy (postmenopausal): Secondary | ICD-10-CM

## 2023-09-02 DIAGNOSIS — I252 Old myocardial infarction: Secondary | ICD-10-CM

## 2023-09-02 DIAGNOSIS — R1011 Right upper quadrant pain: Secondary | ICD-10-CM | POA: Diagnosis not present

## 2023-09-02 DIAGNOSIS — I2510 Atherosclerotic heart disease of native coronary artery without angina pectoris: Secondary | ICD-10-CM | POA: Diagnosis present

## 2023-09-02 DIAGNOSIS — K8020 Calculus of gallbladder without cholecystitis without obstruction: Secondary | ICD-10-CM | POA: Diagnosis present

## 2023-09-02 DIAGNOSIS — K8000 Calculus of gallbladder with acute cholecystitis without obstruction: Secondary | ICD-10-CM | POA: Diagnosis not present

## 2023-09-02 DIAGNOSIS — Z720 Tobacco use: Secondary | ICD-10-CM | POA: Diagnosis present

## 2023-09-02 DIAGNOSIS — Z87891 Personal history of nicotine dependence: Secondary | ICD-10-CM

## 2023-09-02 DIAGNOSIS — Z955 Presence of coronary angioplasty implant and graft: Secondary | ICD-10-CM

## 2023-09-02 DIAGNOSIS — I10 Essential (primary) hypertension: Secondary | ICD-10-CM | POA: Diagnosis present

## 2023-09-02 DIAGNOSIS — Z880 Allergy status to penicillin: Secondary | ICD-10-CM

## 2023-09-02 DIAGNOSIS — Z7982 Long term (current) use of aspirin: Secondary | ICD-10-CM

## 2023-09-02 DIAGNOSIS — Z8249 Family history of ischemic heart disease and other diseases of the circulatory system: Secondary | ICD-10-CM

## 2023-09-02 DIAGNOSIS — E7800 Pure hypercholesterolemia, unspecified: Secondary | ICD-10-CM | POA: Diagnosis present

## 2023-09-02 DIAGNOSIS — Z888 Allergy status to other drugs, medicaments and biological substances status: Secondary | ICD-10-CM

## 2023-09-02 DIAGNOSIS — E039 Hypothyroidism, unspecified: Secondary | ICD-10-CM | POA: Diagnosis present

## 2023-09-02 NOTE — ED Provider Notes (Signed)
 [facility] Provider Note   CSN: [id number] Arrival date & time: 09/02/23  2337     History Chief Complaint  Samuel Wall presents with   Abdominal Pain    HPI Samuel Wall is a 68 y.o. male presenting for sudden onset severe right upper quadrant pain. 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  States that it 8 PM after heavy dinner he had sudden onset severe right upper quadrant pain.  Multiple episodes of emesis. History of gallstones Status post appendectomy as a child.   Samuel Wall's recorded medical, surgical, social, medication list and allergies were reviewed in the Snapshot window as part of the initial history.   Review of Systems   Review of Systems  Constitutional:  Negative for chills and fever.  HENT:  Negative for ear pain and sore throat.   Eyes:  Negative for pain and visual disturbance.  Respiratory:  Negative for cough and shortness of breath.   Cardiovascular:  Negative for chest pain and palpitations.  Gastrointestinal:  Positive for abdominal pain, nausea and vomiting.  Genitourinary:  Negative for dysuria and hematuria.  Musculoskeletal:  Negative for arthralgias and back pain.  Skin:  Negative for color change and rash.  Neurological:  Negative for seizures and syncope.  All other systems reviewed and are negative.   Physical Exam Updated Vital Signs BP (!) 178/99 (BP Location: Right Arm)   Pulse (!) 50   Temp 97.6 F (36.4 C) (Oral)   Resp 16   Ht 5\' 11"  (1.803 m)   Wt 81.6 kg   SpO2 100%   BMI 25.10 kg/m  Physical Exam Vitals and nursing note reviewed.  Constitutional:      General: He is not in acute distress.    Appearance: He is well-developed.  HENT:     Head: Normocephalic and atraumatic.  Eyes:     Conjunctiva/sclera: Conjunctivae normal.  Cardiovascular:     Rate and Rhythm: Normal rate and regular rhythm.     Heart sounds: No murmur heard. Pulmonary:     Effort:  Pulmonary effort is normal. No respiratory distress.     Breath sounds: Normal breath sounds.  Abdominal:     Palpations: Abdomen is soft.     Tenderness: There is abdominal tenderness in the right upper quadrant. There is guarding.  Musculoskeletal:        General: No swelling.     Cervical back: Neck supple.  Skin:    General: Skin is warm and dry.     Capillary Refill: Capillary refill takes less than 2 seconds.  Neurological:     Mental Status: He is alert.  Psychiatric:        Mood and Affect: Mood normal.      ED Course/ Medical Decision Making/ A&P    Procedures .Critical Care  Performed by: Onetha Bile, MD Authorized by: Onetha Bile, MD   Critical care provider statement:    Critical care time (minutes):  80   Critical care was necessary to treat or prevent imminent or life-threatening deterioration of the following conditions: 3X narcotic doses IV.   Critical care was time spent personally by me on the following activities:  Development of treatment plan with Samuel Wall or surrogate, discussions with consultants, evaluation of Samuel Wall's response to treatment, examination of Samuel Wall, ordering and review of laboratory studies, ordering and review of radiographic studies, ordering and performing treatments and interventions, pulse oximetry, re-evaluation of Samuel Wall's condition and review of old  charts   Care discussed with: admitting provider      Medications Ordered in ED Medications - No data to display Medical Decision Making:   TEDD COTTRILL is a 68 y.o. male who presented to the ED today with abdominal pain, detailed above.    Samuel Wall placed on continuous vitals and telemetry monitoring while in ED which was reviewed periodically.  Complete initial physical exam performed, notably the Samuel Wall  was HDS in NAD.     Reviewed and confirmed nursing documentation for past medical history, family history, social history.    Initial Assessment:   With the  Samuel Wall's presentation of abdominal pain, most likely diagnosis is Biliary etiology. Other diagnoses were considered including (but not limited to) gastroenteritis, colitis, small bowel obstruction, appendicitis, cholecystitis, pancreatitis, nephrolithiasis, UTI, pyleonephritis. These are considered less likely due to history of present illness and physical exam findings.   This is most consistent with an acute life/limb threatening illness complicated by underlying chronic conditions.   Initial Plan:  CBC/CMP to evaluate for underlying infectious/metabolic etiology for Samuel Wall's abdominal pain  Lipase to evaluate for pancreatitis  EKG to evaluate for cardiac source of pain  CTAB/Pelvis with contrast to evaluate for structural/surgical etiology of patients' severe abdominal pain.  Urinalysis and repeat physical assessment to evaluate for UTI/Pyelonpehritis  Empiric management of symptoms with escalating pain control and antiemetics as needed.   Initial Study Results:   Laboratory  All laboratory results reviewed without evidence of clinically relevant pathology.    EKG EKG was reviewed independently. Rate, rhythm, axis, intervals all examined and without medically relevant abnormality. ST segments without concerns for elevations.    Radiology All images reviewed independently. Agree with radiology report at this time.   CT ABDOMEN PELVIS W CONTRAST Result Date: 09/03/2023 EXAM: CT ABDOMEN AND PELVIS WITH CONTRAST 09/03/2023 01:00:00 AM TECHNIQUE: CT of the abdomen and pelvis was performed with the administration of 100mL iohexol (OMNIPAQUE) 300 MG/ML solution. Multiplanar reformatted images are provided for review. Automated exposure control, iterative reconstruction, and/or weight based adjustment of the mA/kV was utilized to reduce the radiation dose to as low as reasonably achievable. COMPARISON: CTA abdomen / pelvis dated 11/14/2003. CLINICAL HISTORY: Abdominal pain, acute, nonlocalized. Pt  c/o right sided abd with some nausea and emesis x 1 starting 2 hours ago, pt reports gall stone on a MRI about a year ago, denies fever, diarrhea, urinary symptoms. FINDINGS: LOWER CHEST: Median sternotomy. LIVER: Subcentimeter cyst in segment 4a (image 12). GALLBLADDER AND BILE DUCTS: Small gallstones, without associated inflammatory changes. SPLEEN: No acute abnormality. PANCREAS: No acute abnormality. ADRENAL GLANDS: No acute abnormality. KIDNEYS, URETERS AND BLADDER: No stones in the kidneys or ureters. No hydronephrosis. No perinephric or periureteral stranding. Urinary bladder is unremarkable. GI AND BOWEL: The appendix is not completely visualized, likely surgically absent. No bowel obstruction. PERITONEUM AND RETROPERITONEUM: No ascites. No free air. VASCULATURE: Atherosclerotic calcifications of the abdominal aorta and branch vessels. LYMPH NODES: No lymphadenopathy. REPRODUCTIVE ORGANS: No acute abnormality. BONES AND SOFT TISSUES: Small fat-containing right inguinal hernia. Degenerative changes of the visualized thoracolumbar spine. IMPRESSION: 1. Small gallstones without associated inflammatory changes. 2. Small fat-containing right inguinal hernia. Electronically signed by: Zadie Herter MD 09/03/2023 01:11 AM EDT RP Workstation: [id number]     Consults: Case discussed with general surgery.   Final Reassessment and Plan:   Samuel Wall with severe right upper quadrant pain despite 3X doses of weight-based fentanyl. Lab work shows no focal pathology, CT scan only demonstrates cholelithiasis.  I  believe Samuel Wall suffering from symptomatic cholelithiasis after a very heavy dinner (graduation dinner).  He is failing conservative management of his pain, consulted general surgery who recommended medical admission for Plavix washout and right upper quadrant ultrasound as well as aggressive pain control in the interim.Aaron Aas   Disposition:   Based on the above findings, I believe this Samuel Wall is stable for  admission.    Samuel Wall/family educated about specific findings on our evaluation and explained exact reasons for admission.  Samuel Wall/family educated about clinical situation and time was allowed to answer questions.   Admission team communicated with and agreed with need for admission. Samuel Wall admitted. Samuel Wall  ready to move at this time.     Emergency Department Medication Summary:   Medications  fentaNYL (SUBLIMAZE) injection 100 mcg (100 mcg Intravenous Given 09/03/23 0226)  ondansetron (ZOFRAN) injection 4 mg (4 mg Intravenous Given 09/03/23 0020)  lactated ringers bolus 1,000 mL (0 mLs Intravenous Stopped 09/03/23 0204)  iohexol (OMNIPAQUE) 300 MG/ML solution 100 mL (100 mLs Intravenous Contrast Given 09/03/23 0106)            Clinical Impression: No diagnosis found.   Data Unavailable   Final Clinical Impression(s) / ED Diagnoses Final diagnoses:  None    Rx / DC Orders ED Discharge Orders     None         Onetha Bile, MD 09/03/23 [phone]

## 2023-09-02 NOTE — ED Triage Notes (Signed)
 Pt c/o right sided abd with some nausea and emesis x 1 starting 2 hours ago, pt reports gall stone on a MRI about a year ago, denies fever, diarrhea, urinary symptoms

## 2023-09-03 ENCOUNTER — Emergency Department (HOSPITAL_BASED_OUTPATIENT_CLINIC_OR_DEPARTMENT_OTHER)

## 2023-09-03 ENCOUNTER — Inpatient Hospital Stay (HOSPITAL_COMMUNITY)

## 2023-09-03 ENCOUNTER — Encounter (HOSPITAL_BASED_OUTPATIENT_CLINIC_OR_DEPARTMENT_OTHER): Payer: Self-pay

## 2023-09-03 DIAGNOSIS — Z951 Presence of aortocoronary bypass graft: Secondary | ICD-10-CM | POA: Diagnosis not present

## 2023-09-03 DIAGNOSIS — Z955 Presence of coronary angioplasty implant and graft: Secondary | ICD-10-CM | POA: Diagnosis not present

## 2023-09-03 DIAGNOSIS — Z8249 Family history of ischemic heart disease and other diseases of the circulatory system: Secondary | ICD-10-CM | POA: Diagnosis not present

## 2023-09-03 DIAGNOSIS — R1011 Right upper quadrant pain: Secondary | ICD-10-CM | POA: Diagnosis present

## 2023-09-03 DIAGNOSIS — Z880 Allergy status to penicillin: Secondary | ICD-10-CM | POA: Diagnosis not present

## 2023-09-03 DIAGNOSIS — Z79899 Other long term (current) drug therapy: Secondary | ICD-10-CM | POA: Diagnosis not present

## 2023-09-03 DIAGNOSIS — Z01810 Encounter for preprocedural cardiovascular examination: Secondary | ICD-10-CM | POA: Diagnosis not present

## 2023-09-03 DIAGNOSIS — E890 Postprocedural hypothyroidism: Secondary | ICD-10-CM | POA: Diagnosis present

## 2023-09-03 DIAGNOSIS — E039 Hypothyroidism, unspecified: Secondary | ICD-10-CM | POA: Diagnosis present

## 2023-09-03 DIAGNOSIS — K8000 Calculus of gallbladder with acute cholecystitis without obstruction: Secondary | ICD-10-CM | POA: Diagnosis present

## 2023-09-03 DIAGNOSIS — E785 Hyperlipidemia, unspecified: Secondary | ICD-10-CM | POA: Diagnosis not present

## 2023-09-03 DIAGNOSIS — I252 Old myocardial infarction: Secondary | ICD-10-CM | POA: Diagnosis not present

## 2023-09-03 DIAGNOSIS — Z87891 Personal history of nicotine dependence: Secondary | ICD-10-CM | POA: Diagnosis not present

## 2023-09-03 DIAGNOSIS — E7800 Pure hypercholesterolemia, unspecified: Secondary | ICD-10-CM | POA: Diagnosis present

## 2023-09-03 DIAGNOSIS — Z79890 Hormone replacement therapy (postmenopausal): Secondary | ICD-10-CM | POA: Diagnosis not present

## 2023-09-03 DIAGNOSIS — Z888 Allergy status to other drugs, medicaments and biological substances status: Secondary | ICD-10-CM | POA: Diagnosis not present

## 2023-09-03 DIAGNOSIS — I2510 Atherosclerotic heart disease of native coronary artery without angina pectoris: Secondary | ICD-10-CM | POA: Diagnosis present

## 2023-09-03 DIAGNOSIS — I10 Essential (primary) hypertension: Secondary | ICD-10-CM | POA: Diagnosis present

## 2023-09-03 DIAGNOSIS — K8020 Calculus of gallbladder without cholecystitis without obstruction: Secondary | ICD-10-CM | POA: Diagnosis present

## 2023-09-03 DIAGNOSIS — Z7982 Long term (current) use of aspirin: Secondary | ICD-10-CM | POA: Diagnosis not present

## 2023-09-03 DIAGNOSIS — K819 Cholecystitis, unspecified: Secondary | ICD-10-CM | POA: Diagnosis not present

## 2023-09-03 DIAGNOSIS — Z7902 Long term (current) use of antithrombotics/antiplatelets: Secondary | ICD-10-CM | POA: Diagnosis not present

## 2023-09-03 LAB — COMPREHENSIVE METABOLIC PANEL WITH GFR
ALT: 12 U/L (ref 0–44)
ALT: 13 U/L (ref 0–44)
AST: 20 U/L (ref 15–41)
AST: 22 U/L (ref 15–41)
Albumin: 3.4 g/dL — ABNORMAL LOW (ref 3.5–5.0)
Albumin: 4.4 g/dL (ref 3.5–5.0)
Alkaline Phosphatase: 57 U/L (ref 38–126)
Alkaline Phosphatase: 79 U/L (ref 38–126)
Anion gap: 15 (ref 5–15)
Anion gap: 9 (ref 5–15)
BUN: 15 mg/dL (ref 8–23)
BUN: 18 mg/dL (ref 8–23)
CO2: 22 mmol/L (ref 22–32)
CO2: 23 mmol/L (ref 22–32)
Calcium: 8.6 mg/dL — ABNORMAL LOW (ref 8.9–10.3)
Calcium: 9.3 mg/dL (ref 8.9–10.3)
Chloride: 102 mmol/L (ref 98–111)
Chloride: 106 mmol/L (ref 98–111)
Creatinine, Ser: 0.99 mg/dL (ref 0.61–1.24)
Creatinine, Ser: 1.42 mg/dL — ABNORMAL HIGH (ref 0.61–1.24)
GFR, Estimated: 53 mL/min — ABNORMAL LOW (ref >60)
GFR, Estimated: >60 mL/min (ref >60)
Glucose, Bld: 167 mg/dL — ABNORMAL HIGH (ref 70–99)
Glucose, Bld: 99 mg/dL (ref 70–99)
Potassium: 3.8 mmol/L (ref 3.5–5.1)
Potassium: 3.9 mmol/L (ref 3.5–5.1)
Sodium: 137 mmol/L (ref 135–145)
Sodium: 140 mmol/L (ref 135–145)
Total Bilirubin: 0.3 mg/dL (ref 0.0–1.2)
Total Bilirubin: 0.9 mg/dL (ref 0.0–1.2)
Total Protein: 6.1 g/dL — ABNORMAL LOW (ref 6.5–8.1)
Total Protein: 7.3 g/dL (ref 6.5–8.1)

## 2023-09-03 LAB — URINALYSIS, ROUTINE W REFLEX MICROSCOPIC
Bilirubin Urine: NEGATIVE
Glucose, UA: NEGATIVE mg/dL
Hgb urine dipstick: NEGATIVE
Ketones, ur: NEGATIVE mg/dL
Leukocytes,Ua: NEGATIVE
Nitrite: NEGATIVE
Protein, ur: NEGATIVE mg/dL
Specific Gravity, Urine: 1.01 (ref 1.005–1.030)
pH: 6 (ref 5.0–8.0)

## 2023-09-03 LAB — CBC WITH DIFFERENTIAL/PLATELET
Abs Immature Granulocytes: 0.02 10*3/uL (ref 0.00–0.07)
Basophils Absolute: 0.1 10*3/uL (ref 0.0–0.1)
Basophils Relative: 1 %
Eosinophils Absolute: 0.4 10*3/uL (ref 0.0–0.5)
Eosinophils Relative: 4 %
HCT: 42.8 % (ref 39.0–52.0)
Hemoglobin: 14.5 g/dL (ref 13.0–17.0)
Immature Granulocytes: 0 %
Lymphocytes Relative: 28 %
Lymphs Abs: 2.8 10*3/uL (ref 0.7–4.0)
MCH: 32.2 pg (ref 26.0–34.0)
MCHC: 33.9 g/dL (ref 30.0–36.0)
MCV: 94.9 fL (ref 80.0–100.0)
Monocytes Absolute: 0.7 10*3/uL (ref 0.1–1.0)
Monocytes Relative: 7 %
Neutro Abs: 5.9 10*3/uL (ref 1.7–7.7)
Neutrophils Relative %: 60 %
Platelets: 330 10*3/uL (ref 150–400)
RBC: 4.51 MIL/uL (ref 4.22–5.81)
RDW: 13.5 % (ref 11.5–15.5)
WBC: 9.7 10*3/uL (ref 4.0–10.5)
nRBC: 0 % (ref 0.0–0.2)

## 2023-09-03 LAB — CBC
HCT: 41.9 % (ref 39.0–52.0)
Hemoglobin: 13.5 g/dL (ref 13.0–17.0)
MCH: 32.1 pg (ref 26.0–34.0)
MCHC: 32.2 g/dL (ref 30.0–36.0)
MCV: 99.5 fL (ref 80.0–100.0)
Platelets: 274 10*3/uL (ref 150–400)
RBC: 4.21 MIL/uL — ABNORMAL LOW (ref 4.22–5.81)
RDW: 13.6 % (ref 11.5–15.5)
WBC: 9.3 10*3/uL (ref 4.0–10.5)
nRBC: 0 % (ref 0.0–0.2)

## 2023-09-03 LAB — MAGNESIUM: Magnesium: 2 mg/dL (ref 1.7–2.4)

## 2023-09-03 LAB — LIPASE, BLOOD: Lipase: 34 U/L (ref 11–51)

## 2023-09-03 MED ORDER — ONDANSETRON HCL 4 MG/2ML IJ SOLN
4.0000 mg | Freq: Once | INTRAMUSCULAR | Status: AC
  Administered 2023-09-03: 4 mg via INTRAVENOUS
  Filled 2023-09-03: qty 2

## 2023-09-03 MED ORDER — METRONIDAZOLE 500 MG/100ML IV SOLN
500.0000 mg | Freq: Two times a day (BID) | INTRAVENOUS | Status: DC
  Administered 2023-09-03 – 2023-09-07 (×7): 500 mg via INTRAVENOUS
  Filled 2023-09-03 (×8): qty 100

## 2023-09-03 MED ORDER — ACETAMINOPHEN 325 MG PO TABS: 650.0000 mg | ORAL_TABLET | Freq: Four times a day (QID) | ORAL | Status: DC | PRN

## 2023-09-03 MED ORDER — LACTATED RINGERS IV BOLUS
1000.0000 mL | Freq: Once | INTRAVENOUS | Status: AC
  Administered 2023-09-03: 1000 mL via INTRAVENOUS

## 2023-09-03 MED ORDER — ACETAMINOPHEN 650 MG RE SUPP
650.0000 mg | Freq: Four times a day (QID) | RECTAL | Status: DC | PRN
Start: 2023-09-03 — End: 2023-09-06

## 2023-09-03 MED ORDER — FLUTICASONE PROPIONATE 50 MCG/ACT NA SUSP
1.0000 | Freq: Every day | NASAL | Status: DC | PRN
  Administered 2023-09-03: 1 via NASAL
  Filled 2023-09-03: qty 16

## 2023-09-03 MED ORDER — ASPIRIN 81 MG PO TBEC
81.0000 mg | DELAYED_RELEASE_TABLET | Freq: Every day | ORAL | Status: DC
  Administered 2023-09-03 – 2023-09-06 (×4): 81 mg via ORAL
  Filled 2023-09-03 (×4): qty 1

## 2023-09-03 MED ORDER — LEVOTHYROXINE SODIUM 50 MCG PO TABS
50.0000 ug | ORAL_TABLET | Freq: Every day | ORAL | Status: DC
  Administered 2023-09-03 – 2023-09-07 (×5): 50 ug via ORAL
  Filled 2023-09-03 (×5): qty 1

## 2023-09-03 MED ORDER — PANTOPRAZOLE SODIUM 40 MG IV SOLR
40.0000 mg | Freq: Once | INTRAVENOUS | Status: AC
  Administered 2023-09-03: 40 mg via INTRAVENOUS
  Filled 2023-09-03: qty 10

## 2023-09-03 MED ORDER — ONDANSETRON HCL 4 MG PO TABS
4.0000 mg | ORAL_TABLET | Freq: Four times a day (QID) | ORAL | Status: DC | PRN
  Administered 2023-09-07: 4 mg via ORAL
  Filled 2023-09-03: qty 1

## 2023-09-03 MED ORDER — FENTANYL CITRATE PF 50 MCG/ML IJ SOSY
100.0000 ug | PREFILLED_SYRINGE | INTRAMUSCULAR | Status: AC | PRN
  Administered 2023-09-03 (×3): 100 ug via INTRAVENOUS
  Filled 2023-09-03 (×3): qty 2

## 2023-09-03 MED ORDER — IOHEXOL 300 MG/ML  SOLN
100.0000 mL | Freq: Once | INTRAMUSCULAR | Status: AC | PRN
  Administered 2023-09-03: 100 mL via INTRAVENOUS

## 2023-09-03 MED ORDER — ONDANSETRON HCL 4 MG/2ML IJ SOLN
4.0000 mg | Freq: Four times a day (QID) | INTRAMUSCULAR | Status: DC | PRN
  Administered 2023-09-03 – 2023-09-06 (×2): 4 mg via INTRAVENOUS
  Filled 2023-09-03 (×2): qty 2

## 2023-09-03 MED ORDER — PANTOPRAZOLE SODIUM 40 MG PO TBEC
40.0000 mg | DELAYED_RELEASE_TABLET | Freq: Every day | ORAL | Status: DC
  Administered 2023-09-04 – 2023-09-07 (×3): 40 mg via ORAL
  Filled 2023-09-03 (×3): qty 1

## 2023-09-03 MED ORDER — CIPROFLOXACIN IN D5W 400 MG/200ML IV SOLN
400.0000 mg | Freq: Two times a day (BID) | INTRAVENOUS | Status: DC
  Administered 2023-09-03 – 2023-09-07 (×8): 400 mg via INTRAVENOUS
  Filled 2023-09-03 (×8): qty 200

## 2023-09-03 MED ORDER — SODIUM CHLORIDE 0.9 % IV SOLN: INTRAVENOUS | Status: AC

## 2023-09-03 MED ORDER — HYDROMORPHONE HCL 1 MG/ML IJ SOLN
0.5000 mg | INTRAMUSCULAR | Status: AC | PRN
  Administered 2023-09-03 (×3): 0.5 mg via INTRAVENOUS
  Filled 2023-09-03 (×2): qty 0.5
  Filled 2023-09-03: qty 1

## 2023-09-03 MED ORDER — EZETIMIBE 10 MG PO TABS
10.0000 mg | ORAL_TABLET | Freq: Every day | ORAL | Status: DC
  Administered 2023-09-03 – 2023-09-06 (×4): 10 mg via ORAL
  Filled 2023-09-03 (×4): qty 1

## 2023-09-03 MED ORDER — ROSUVASTATIN CALCIUM 5 MG PO TABS
5.0000 mg | ORAL_TABLET | ORAL | Status: DC
  Administered 2023-09-04: 5 mg via ORAL
  Filled 2023-09-03: qty 1

## 2023-09-03 MED ORDER — HYDROMORPHONE HCL 1 MG/ML IJ SOLN
0.5000 mg | INTRAMUSCULAR | Status: AC | PRN
  Administered 2023-09-03 – 2023-09-04 (×8): 0.5 mg via INTRAVENOUS
  Filled 2023-09-03 (×8): qty 0.5

## 2023-09-03 MED ORDER — METOPROLOL SUCCINATE ER 25 MG PO TB24
25.0000 mg | ORAL_TABLET | Freq: Every day | ORAL | Status: DC
  Administered 2023-09-03 – 2023-09-06 (×3): 25 mg via ORAL
  Filled 2023-09-03 (×3): qty 1

## 2023-09-03 NOTE — Progress Notes (Signed)
 Mobility Specialist - Progress Note   09/03/23 0847  Mobility  Activity Ambulated independently in hallway;Ambulated independently to bathroom  Level of Assistance Independent  Assistive Device None  Distance Ambulated (ft) 500 ft  Activity Response Tolerated well  Mobility Referral Yes  Mobility visit 1 Mobility  Mobility Specialist Start Time (ACUTE ONLY) [id number]  Mobility Specialist Stop Time (ACUTE ONLY) 0836  Mobility Specialist Time Calculation (min) (ACUTE ONLY) 13 min   Pt received in bed and agreeable to mobility. No complaints during session. Pt to bed after session with all needs met.    [facility]

## 2023-09-03 NOTE — ED Notes (Signed)
 [facility] called for transport.

## 2023-09-03 NOTE — Consult Note (Signed)
 Reason for Consult:ab pain Referring Physician: Dr Eligio Grumbling is an 68 y.o. male.  HPI: 54 yom with history of CAB, on plavix and asa who presents with first episode of ab pain in ruq. This occurred suddenly last night after eating. Associated with nausea and episode of emesis.  No fever. Nothing making it better and he went to outside ER.  Noted to have wbc of 9.7, lfts normal, lipase nl. He had a ct that showed small gallstones without inflammatory changes.  He was then transferred to hospital and underwent us  that shows cholelithiasis, 6 mm gb wall and trace fluid c/w cholecystitis.   Past Medical History:  Diagnosis Date   CAD (coronary artery disease)    history of ruptured plaque in 1999 without recurrence   Family history of ischemic heart disease    HTN (hypertension)    Hypercholesteremia    Normal nuclear stress test 2007   Tobacco abuse     Past Surgical History:  Procedure Laterality Date   APPENDECTOMY     CARDIAC CATHETERIZATION     ROTATOR CUFF REPAIR Right    THYROIDECTOMY      Family History  Problem Relation Age of Onset   Coronary artery disease Father 56       CABG   Hypertension Father    Heart disease Father     Social History:  reports that he has quit smoking. His smoking use included cigarettes. He does not have any smokeless tobacco history on file. He reports that he does not drink alcohol and does not use drugs.  Allergies:  Allergies  Allergen Reactions   Valium Other (See Comments)    STOPS HEART!   Lipitor [Atorvastatin] Other (See Comments)    Body aches   Penicillins Other (See Comments)    Reaction from military injection    Medications: Prior to Admission:  Medications Prior to Admission  Medication Sig Dispense Refill Last Dose/Taking   aspirin 81 MG tablet Take 81 mg by mouth at bedtime.   09/02/2023   clopidogrel (PLAVIX) 75 MG tablet Take 75 mg by mouth at bedtime.   09/02/2023   ezetimibe (ZETIA) 10 MG tablet Take  10 mg by mouth at bedtime.   09/02/2023   fluticasone (FLONASE) 50 MCG/ACT nasal spray Place 2 sprays into both nostrils as needed for allergies.   Unknown   Glucosamine HCl (GLUCOSAMINE PO) Take 1 tablet by mouth in the morning and at bedtime.   09/02/2023   ketoconazole (NIZORAL) 2 % shampoo Apply 1 Application topically 2 (two) times a week.   09/02/2023   levothyroxine (SYNTHROID) 50 MCG tablet Take 50 mcg by mouth daily.   09/02/2023   metoprolol succinate (TOPROL-XL) 25 MG 24 hr tablet Take 25 mg by mouth at bedtime.   09/02/2023   Multiple Vitamins-Minerals (MULTIVITAMIN WITH MINERALS) tablet Take 1 tablet by mouth daily.   09/02/2023   nitroGLYCERIN (NITROSTAT) 0.4 MG SL tablet Place 0.4 mg under the tongue.   Unknown   omeprazole (PRILOSEC) 20 MG capsule Take 20 mg by mouth daily.   09/02/2023   rosuvastatin  (CRESTOR ) 5 MG tablet Take 5 mg by mouth every Monday, Wednesday, and Friday.   09/01/2023    Results for orders placed or performed during the hospital encounter of 09/02/23 (from the past 48 hours)  Comprehensive metabolic panel     Status: Abnormal   Collection Time: 09/02/23 11:54 PM  Result Value Ref Range   Sodium 140 135 -  145 mmol/L   Potassium 3.9 3.5 - 5.1 mmol/L   Chloride 102 98 - 111 mmol/L   CO2 23 22 - 32 mmol/L   Glucose, Bld 167 (H) 70 - 99 mg/dL    Comment: Glucose reference range applies only to samples taken after fasting for at least 8 hours.   BUN 18 8 - 23 mg/dL   Creatinine, Ser 1.47 (H) 0.61 - 1.24 mg/dL   Calcium  9.3 8.9 - 10.3 mg/dL   Total Protein 7.3 6.5 - 8.1 g/dL   Albumin 4.4 3.5 - 5.0 g/dL   AST 20 15 - 41 U/L   ALT 12 0 - 44 U/L   Alkaline Phosphatase 79 38 - 126 U/L   Total Bilirubin 0.3 0.0 - 1.2 mg/dL   GFR, Estimated 53 (L) >60 mL/min    Comment: (NOTE) Calculated using the CKD-EPI Creatinine Equation (2021)    Anion gap 15 5 - 15    Comment: Performed at [facility], [address] [place], [place] [address]  Lipase, blood      Status: None   Collection Time: 09/02/23 11:54 PM  Result Value Ref Range   Lipase 34 11 - 51 U/L    Comment: Performed at [facility], [address] [place], [place] [address]  CBC with Diff     Status: None   Collection Time: 09/02/23 11:54 PM  Result Value Ref Range   WBC 9.7 4.0 - 10.5 K/uL   RBC 4.51 4.22 - 5.81 MIL/uL   Hemoglobin 14.5 13.0 - 17.0 g/dL   HCT 65.7 84.6 - 96.2 %   MCV 94.9 80.0 - 100.0 fL   MCH 32.2 26.0 - 34.0 pg   MCHC 33.9 30.0 - 36.0 g/dL   RDW 95.2 84.1 - 32.4 %   Platelets 330 150 - 400 K/uL   nRBC 0.0 0.0 - 0.2 %   Neutrophils Relative % 60 %   Neutro Abs 5.9 1.7 - 7.7 K/uL   Lymphocytes Relative 28 %   Lymphs Abs 2.8 0.7 - 4.0 K/uL   Monocytes Relative 7 %   Monocytes Absolute 0.7 0.1 - 1.0 K/uL   Eosinophils Relative 4 %   Eosinophils Absolute 0.4 0.0 - 0.5 K/uL   Basophils Relative 1 %   Basophils Absolute 0.1 0.0 - 0.1 K/uL   Immature Granulocytes 0 %   Abs Immature Granulocytes 0.02 0.00 - 0.07 K/uL    Comment: Performed at [facility], [address] [place], [place] [address]  Urinalysis, Routine w reflex microscopic -Urine, Clean Catch     Status: None   Collection Time: 09/03/23  2:23 AM  Result Value Ref Range   Color, Urine YELLOW YELLOW   APPearance CLEAR CLEAR   Specific Gravity, Urine 1.010 1.005 - 1.030   pH 6.0 5.0 - 8.0   Glucose, UA NEGATIVE NEGATIVE mg/dL   Hgb urine dipstick NEGATIVE NEGATIVE   Bilirubin Urine NEGATIVE NEGATIVE   Ketones, ur NEGATIVE NEGATIVE mg/dL   Protein, ur NEGATIVE NEGATIVE mg/dL   Nitrite NEGATIVE NEGATIVE   Leukocytes,Ua NEGATIVE NEGATIVE    Comment: Microscopic not done on urines with negative protein, blood, leukocytes, nitrite, or glucose < 500 mg/dL. Performed at [facility], [address] [place], [place] [address]     US  Abdomen Limited RUQ (LIVER/GB) Result Date: 09/03/2023 CLINICAL DATA:  8 hours history epigastric pain, nausea. Gallstones  noted on CT today. EXAM:  ULTRASOUND ABDOMEN LIMITED RIGHT UPPER QUADRANT COMPARISON:  CT with IV contrast earlier today. FINDINGS: Gallbladder: There are stones and sludge in the gallbladder, largest visible stone 5 mm. There is a thickened free wall measuring 6.8 mm, trace pericholecystic fluid. There is no positive sonographic Murphy sign, but the patient has received pain medication and the Murphy's sign could be suppressed. Common bile duct: Diameter: 3.4 mm.  No intrahepatic bile duct dilatation. Liver: No focal lesion identified. Within normal limits in parenchymal echogenicity. Portal vein is patent on color Doppler imaging with normal direction of blood flow towards the liver. Other: None. IMPRESSION: 1. Cholelithiasis with thickened free wall and trace pericholecystic fluid. No positive sonographic Murphy sign, but the patient has received pain medication and the Murphy's sign could be suppressed. Other findings are suspicious for cholecystitis. 2. No biliary ductal dilatation. Electronically Signed   By: Denman Fischer M.D.   On: 09/03/2023 06:31   CT ABDOMEN PELVIS W CONTRAST Result Date: 09/03/2023 EXAM: CT ABDOMEN AND PELVIS WITH CONTRAST 09/03/2023 01:00:00 AM TECHNIQUE: CT of the abdomen and pelvis was performed with the administration of 100mL iohexol (OMNIPAQUE) 300 MG/ML solution. Multiplanar reformatted images are provided for review. Automated exposure control, iterative reconstruction, and/or weight based adjustment of the mA/kV was utilized to reduce the radiation dose to as low as reasonably achievable. COMPARISON: CTA abdomen / pelvis dated 11/14/2003. CLINICAL HISTORY: Abdominal pain, acute, nonlocalized. Pt c/o right sided abd with some nausea and emesis x 1 starting 2 hours ago, pt reports gall stone on a MRI about a year ago, denies fever, diarrhea, urinary symptoms. FINDINGS: LOWER CHEST: Median sternotomy. LIVER: Subcentimeter cyst in segment 4a (image 12). GALLBLADDER AND BILE DUCTS:  Small gallstones, without associated inflammatory changes. SPLEEN: No acute abnormality. PANCREAS: No acute abnormality. ADRENAL GLANDS: No acute abnormality. KIDNEYS, URETERS AND BLADDER: No stones in the kidneys or ureters. No hydronephrosis. No perinephric or periureteral stranding. Urinary bladder is unremarkable. GI AND BOWEL: The appendix is not completely visualized, likely surgically absent. No bowel obstruction. PERITONEUM AND RETROPERITONEUM: No ascites. No free air. VASCULATURE: Atherosclerotic calcifications of the abdominal aorta and branch vessels. LYMPH NODES: No lymphadenopathy. REPRODUCTIVE ORGANS: No acute abnormality. BONES AND SOFT TISSUES: Small fat-containing right inguinal hernia. Degenerative changes of the visualized thoracolumbar spine. IMPRESSION: 1. Small gallstones without associated inflammatory changes. 2. Small fat-containing right inguinal hernia. Electronically signed by: Zadie Herter MD 09/03/2023 01:11 AM EDT RP Workstation: [id number]    Review of Systems  Constitutional:  Negative for fever.  Gastrointestinal:  Positive for abdominal pain and nausea.  All other systems reviewed and are negative.  Blood pressure (!) 140/66, pulse 65, temperature 98.4 F (36.9 C), temperature source Oral, resp. rate 14, height 5\' 11"  (1.803 m), weight 81.6 kg, SpO2 96%. Physical Exam Vitals reviewed.  Constitutional:      Appearance: He is well-developed.  Eyes:     General: No scleral icterus. Cardiovascular:     Rate and Rhythm: Normal rate.  Pulmonary:     Effort: Pulmonary effort is normal.  Abdominal:     Palpations: Abdomen is soft.     Tenderness: There is abdominal tenderness (mild) in the right upper quadrant.  Skin:    Capillary Refill: Capillary refill takes less than 2 seconds.  Neurological:     General: No focal deficit present.     Mental Status: He is alert.  Psychiatric:        Mood and Affect: Mood normal.  Behavior: Behavior normal.      Assessment/Plan: Biliary colic, poss early cholecystitis -reasonable to do abx for this given still has some tenderness -not sure why on plavix/asa still, cardiologist at [facility]- reasonable to have them see him here prior to surgery with history of CAB -will let him have clears today -hold plavix, may have 81 mg asa -will decide tomorrow on surgical timing -discussed lap chole with he and his wife  Enid Harry 09/03/2023, 10:28 AM

## 2023-09-03 NOTE — Plan of Care (Signed)
°  Problem: Clinical Measurements: Goal: Will remain free from infection Outcome: Progressing   Problem: Activity: Goal: Risk for activity intolerance will decrease Outcome: Progressing   Problem: Nutrition: Goal: Adequate nutrition will be maintained Outcome: Progressing

## 2023-09-03 NOTE — Progress Notes (Signed)
 Hospitalist Transfer Note:    Nursing staff, Please call [facility] Admits & Consults System-Wide number on Amion [phone]) as soon as patient's arrival, so appropriate admitting provider can evaluate the pt.  Transferring facility: [facility] Requesting provider: Dr. Urban Garden (EDP at [facility]) Reason for transfer: admission for further evaluation and management of right quadrant pain.     68 year old male with history of coronary artery disease status post CABG, symptomatic cholelithiasis which is reportedly being managed as an outpatient, who presented to [facility] ED complaining of worsening right upper quadrant abdominal discomfort over the course the last day, which started while eating food at a graduation party.  He denies any associated nausea, vomiting, fever, chills.  Patient conveys that he really takes any pain medication, but is continued to experience significant right upper quadrant abdominal discomfort even after a total of 300 mcg of IV fentanyl administered at [facility] this evening.  Medical history includes CAD status post CABG, for which he is on aspirin and Plavix.  Vital signs in the ED were notable for the following: Afebrile; heart rates in the 60s to 70s; systolic blood pressures in the 140s to 160s, respiratory rate 16-18, oxygen saturation 97 to 100% on room air.  Labs were notable for CMP, which shows liver enzymes within normal limits.  Nonelevated lipase.  CBC notable for white cell count 9700.  Imaging notable for CT abd/pelvis showed cholelithiasis, without evidence of choledocholithiasis, CBD dilation, or acute cholecystitis.   EDP d/w on-call general surgery, Dr. Melton Squires, who recommends [facility] admission to either [facility] or [facility] for additional evaluation/management, including recommendation for right upper quadrant ultrasound; Dr. Melton Squires conveys that general surgery will formally consult, but also notes that a Plavix washout period would like likely need to occur if it  is subsequently determined that the patient requires surgical intervention.  Subsequently, I accepted this patient for transfer for inpatient admission to a med-tele bed at [facility] or [facility] (first available) for further work-up and management of the above.      Camelia Cavalier, DO Hospitalist

## 2023-09-03 NOTE — H&P (Signed)
 History and Physical    PatientIVO Samuel Wall:[id number] DOB: Apr 20, 1955 DOA: 09/02/2023 DOS: the patient was seen and examined on 09/03/2023 PCP: Patient, No Pcp Per  Patient coming from: Home  Chief Complaint:  Chief Complaint  Patient presents with   Abdominal Pain   HPI: Samuel Wall is a 68 y.o. male with medical history significant of tobacco abuse in remission, CAD s/p CABG, family history ischemic heart disease, hypertension, hyperlipidemia who presented to the emergency with complaints of right upper quadrant pain associated with nausea and emesis a few hours after he ate a barbecue at a graduation party yesterday.  He has not been having these symptoms until yesterday, but stated recently he has noticed his stools have been softer for the past 2 months. No diarrhea, constipation, melena or hematochezia.  No flank pain, dysuria, frequency or hematuria. He denied fever, chills, rhinorrhea, sore throat, wheezing or hemoptysis.  No chest pain, palpitations, diaphoresis, PND, orthopnea or pitting edema of the lower extremities.   No polyuria, polydipsia, polyphagia or blurred vision.   ED course: Initial vital signs were temperature 97.6 F, pulse 63, respirations 16, BP 170/99 mmHg O2 sat 100% on room air.  Patient received 1000 mL LR bolus, fentanyl 100 mcg IVP x 1, ondansetron 4 mg IVP x 1 and pantoprazole 40 mg IVP.  Lab work: Urinalysis, CBC and lipase were normal.  CMP showed a glucose level of 167 and creatinine of 1.42 mg/dL, but was otherwise unremarkable.  Imaging: CT abdomen/pelvis with contrast showed small gallstone without associated inflammatory changes.  Small fat-containing right inguinal hernia.  RUQ ultrasound showing cholelithiasis with thickened free wall and trace pericholecystic fluid.  No positive Morphis sign, but the patient has recently received opioid medication.  No other findings are suspicious for cholecystitis.  No biliary ductal dilatation.    Review of Systems: As mentioned in the history of present illness. All other systems reviewed and are negative.  Past Medical History:  Diagnosis Date   CAD (coronary artery disease)    history of ruptured plaque in 1999 without recurrence   Family history of ischemic heart disease    HTN (hypertension)    Hypercholesteremia    Normal nuclear stress test 2007   Tobacco abuse    Past Surgical History:  Procedure Laterality Date   APPENDECTOMY     CARDIAC CATHETERIZATION     ROTATOR CUFF REPAIR Right    THYROIDECTOMY     Social History:  reports that he has quit smoking. His smoking use included cigarettes. He does not have any smokeless tobacco history on file. He reports that he does not drink alcohol and does not use drugs.  Allergies  Allergen Reactions   Valium Other (See Comments)    STOPS HEART!   Lipitor [Atorvastatin] Other (See Comments)    Body aches   Penicillins Other (See Comments)    Reaction from military injection    Family History  Problem Relation Age of Onset   Coronary artery disease Father 61       CABG   Hypertension Father    Heart disease Father     Prior to Admission medications   Medication Sig Start Date End Date Taking? Authorizing Provider  ketoconazole (NIZORAL) 2 % shampoo Apply topically. 08/31/23  Yes [provider]  aspirin 81 MG tablet Take 162 mg by mouth daily.     [provider]  ezetimibe (ZETIA) 10 MG tablet Take 10 mg by mouth daily.  [provider]  levothyroxine (SYNTHROID) 50 MCG tablet Take 50 mcg by mouth daily.    [provider]  metoprolol succinate (TOPROL-XL) 25 MG 24 hr tablet Take 25 mg by mouth daily.    [provider]  omeprazole (PRILOSEC) 20 MG capsule Take 20 mg by mouth daily.    [provider]  Ranitidine HCl (ZANTAC PO) Take 150 mg by mouth daily.     [provider]  rosuvastatin  (CRESTOR ) 10 MG tablet Take 1 tablet (10 mg total) by mouth  daily. 11/22/11   Nahser, Lela Purple, MD  valsartan  (DIOVAN ) 160 MG tablet Take 1 tablet (160 mg total) by mouth 2 (two) times daily. 11/22/11   Nahser, Lela Purple, MD    Physical Exam: Vitals:   09/03/23 0400 09/03/23 0415 09/03/23 0430 09/03/23 0530  BP: (!) 140/71 131/72 135/71 (!) 161/74  Pulse: 68 66 70 65  Resp:   18 20  Temp:    (!) 97.5 F (36.4 C)  TempSrc:    Oral  SpO2: 92% 93% 93% 100%  Weight:      Height:       Physical Exam Vitals and nursing note reviewed.  Constitutional:      General: He is awake. He is not in acute distress.    Appearance: He is well-developed. He is ill-appearing.  HENT:     Head: Normocephalic.     Nose: No rhinorrhea.     Mouth/Throat:     Mouth: Mucous membranes are moist.  Eyes:     General: No scleral icterus.    Pupils: Pupils are equal, round, and reactive to light.  Neck:     Vascular: No JVD.  Cardiovascular:     Rate and Rhythm: Normal rate and regular rhythm.     Heart sounds: S1 normal and S2 normal.  Pulmonary:     Effort: Pulmonary effort is normal.     Breath sounds: Normal breath sounds. No wheezing, rhonchi or rales.  Abdominal:     General: Abdomen is protuberant. Bowel sounds are normal. There is distension.     Palpations: Abdomen is soft.     Tenderness: There is generalized abdominal tenderness. There is no right CVA tenderness, left CVA tenderness or guarding.  Musculoskeletal:     Cervical back: Neck supple.     Right lower leg: No edema.     Left lower leg: No edema.  Skin:    General: Skin is warm and dry.  Neurological:     General: No focal deficit present.     Mental Status: He is alert and oriented to person, place, and time.  Psychiatric:        Mood and Affect: Mood normal.        Behavior: Behavior normal. Behavior is cooperative.     Data Reviewed:  Results are pending, will review when available.  Assessment and Plan: Principal Problem:   RUQ pain In the setting of:    Cholelithiasis Telemetry/inpatient. Continue IV fluids. CLD per general surgery. Keep n.p.o. after midnight. Analgesics as needed. Antiemetics as needed. Pantoprazole 40 mg IVP x 1. Follow CBC, CMP in AM. General surgery consult appreciated. -Will hold clopidogrel. -Will obtain cardiology preop consult.  Active Problems:   CAD (coronary artery disease) Continue aspirin. Hold clopidogrel. Continue statin and beta-blocker.    HTN (hypertension) Continue metoprolol 25 mg p.o. bedtime.    Hypercholesteremia Continue rosuvastatin  5 mg p.o. 3 times a week. Continue ezetimibe 10 mg p.o.  bedtime.    Hypothyroidism  Continue levothyroxine 50 mcg p.o. daily.    Tobacco abuse In remission.    Advance Care Planning:   Code Status: Full Code   Consults: [facility] Donavan Fuchs, MD). Cardiology consult request message sent.  Family Communication:   Severity of Illness: The appropriate patient status for this patient is INPATIENT. Inpatient status is judged to be reasonable and necessary in order to provide the required intensity of service to ensure the patient's safety. The patient's presenting symptoms, physical exam findings, and initial radiographic and laboratory data in the context of their chronic comorbidities is felt to place them at high risk for further clinical deterioration. Furthermore, it is not anticipated that the patient will be medically stable for discharge from the hospital within 2 midnights of admission.   * I certify that at the point of admission it is my clinical judgment that the patient will require inpatient hospital care spanning beyond 2 midnights from the point of admission due to high intensity of service, high risk for further deterioration and high frequency of surveillance required.*  Author: Danice Dural, MD 09/03/2023 7:41 AM  For on call review [email or web address].   This document was prepared using Dragon voice recognition  software and may contain some unintended transcription errors.

## 2023-09-03 NOTE — Progress Notes (Signed)
 Patient arrive via carelink to room 1302. Patient spouse is also at bedside.

## 2023-09-04 DIAGNOSIS — E785 Hyperlipidemia, unspecified: Secondary | ICD-10-CM | POA: Diagnosis not present

## 2023-09-04 DIAGNOSIS — I2510 Atherosclerotic heart disease of native coronary artery without angina pectoris: Secondary | ICD-10-CM

## 2023-09-04 DIAGNOSIS — I10 Essential (primary) hypertension: Secondary | ICD-10-CM | POA: Diagnosis not present

## 2023-09-04 DIAGNOSIS — Z01810 Encounter for preprocedural cardiovascular examination: Secondary | ICD-10-CM | POA: Diagnosis not present

## 2023-09-04 DIAGNOSIS — R1011 Right upper quadrant pain: Secondary | ICD-10-CM | POA: Diagnosis not present

## 2023-09-04 LAB — CBC
HCT: 40.7 % (ref 39.0–52.0)
Hemoglobin: 12.7 g/dL — ABNORMAL LOW (ref 13.0–17.0)
MCH: 31.6 pg (ref 26.0–34.0)
MCHC: 31.2 g/dL (ref 30.0–36.0)
MCV: 101.2 fL — ABNORMAL HIGH (ref 80.0–100.0)
Platelets: 217 10*3/uL (ref 150–400)
RBC: 4.02 MIL/uL — ABNORMAL LOW (ref 4.22–5.81)
RDW: 13.6 % (ref 11.5–15.5)
WBC: 7.3 10*3/uL (ref 4.0–10.5)
nRBC: 0 % (ref 0.0–0.2)

## 2023-09-04 LAB — COMPREHENSIVE METABOLIC PANEL WITH GFR
ALT: 11 U/L (ref 0–44)
AST: 18 U/L (ref 15–41)
Albumin: 3.2 g/dL — ABNORMAL LOW (ref 3.5–5.0)
Alkaline Phosphatase: 52 U/L (ref 38–126)
Anion gap: 8 (ref 5–15)
BUN: 10 mg/dL (ref 8–23)
CO2: 24 mmol/L (ref 22–32)
Calcium: 8.4 mg/dL — ABNORMAL LOW (ref 8.9–10.3)
Chloride: 104 mmol/L (ref 98–111)
Creatinine, Ser: 1.19 mg/dL (ref 0.61–1.24)
GFR, Estimated: >60 mL/min (ref >60)
Glucose, Bld: 104 mg/dL — ABNORMAL HIGH (ref 70–99)
Potassium: 4.4 mmol/L (ref 3.5–5.1)
Sodium: 136 mmol/L (ref 135–145)
Total Bilirubin: 1 mg/dL (ref 0.0–1.2)
Total Protein: 5.7 g/dL — ABNORMAL LOW (ref 6.5–8.1)

## 2023-09-04 LAB — HIV ANTIBODY (ROUTINE TESTING W REFLEX): HIV Screen 4th Generation wRfx: NONREACTIVE

## 2023-09-04 MED ORDER — TRAMADOL HCL 50 MG PO TABS
50.0000 mg | ORAL_TABLET | Freq: Four times a day (QID) | ORAL | Status: DC | PRN
  Administered 2023-09-04: 50 mg via ORAL
  Administered 2023-09-05 – 2023-09-06 (×3): 100 mg via ORAL
  Filled 2023-09-04 (×3): qty 2
  Filled 2023-09-04: qty 1
  Filled 2023-09-04: qty 2

## 2023-09-04 MED ORDER — HYDROMORPHONE HCL 1 MG/ML IJ SOLN
0.5000 mg | Freq: Once | INTRAMUSCULAR | Status: AC | PRN
  Administered 2023-09-04: 0.5 mg via INTRAVENOUS
  Filled 2023-09-04: qty 0.5

## 2023-09-04 NOTE — Consult Note (Addendum)
 Cardiology Consultation   Patient ID: CHEN SAADEH MRN: [id number]; DOB: 03/11/1956  Admit date: 09/02/2023 Date of Consult: 09/04/2023  PCP:  Patient, No Pcp Per   [facility] Providers Cardiologist: Followed by [facility]    Patient Profile: Samuel Wall is a 68 y.o. male with a hx of CAD, HTN, HLD, history of tobacco use who is being seen 09/04/2023 for preoperative evaluation at the request of Dr. Rudine Cos.  History of Present Illness: Mr. Samuel Wall is a 68 year old male with above medical history who is followed by [facility].  Per chart review, patient previously had a STEMI in 2015 and received a DES to RCA.  Later had NSTEMI in 11/2021, ultimately underwent CABG with LIMA-LAD, SVG-RCA, SVG-proximal circumflex.  Echocardiogram in 11/2021 at time of NSTEMI showed EF 55-60%.  He was last seen by [facility] in 04/2023.  At that time, patient was unable to tolerate Crestor  20 mg 3 days a week.  Was reduced to Crestor  5 mg 3 days a week and Zetia.  He was not having any chest pain at that time.  Patient presented to the ED at [facility] on 5/31 complaining of right sided abdominal pain with nausea and vomiting.  Vital signs in the ED showed that patient was afebrile with heart rates in the 60s-70s, oxygen saturation 97-100% on room air, systolic BP in the 140s-160s.  Lab work significant for normal LFTs, creatinine 1.42, WBC 9.7.  UA negative.  CT abdomen pelvis showed small gallstones without associated inflammatory changes.  Right upper quadrant abdominal ultrasound showed cholelithiasis with thickened free wall and trace pericholecystic fluid.  Patient was transferred to [facility].  Seen by general surgery and is currently tentatively scheduled for lap chole on Wednesday 6/4.  Plavix held perioperatively.  Cardiology consulted for preop eval  On interview, patient reports that he has been doing very well from a cardiac  standpoint.  He denies recent episodes of chest pain, shortness of breath, palpitations, dizziness, syncope, near syncope.  He is able to go up and down stairs, do housework, and do yard work without developing any chest pain or shortness of breath.  He has been compliant with his cardiac medications and follows up regularly with his cardiologist at [facility]   Past Medical History:  Diagnosis Date   CAD (coronary artery disease)    history of ruptured plaque in 1999 without recurrence   Family history of ischemic heart disease    HTN (hypertension)    Hypercholesteremia    Normal nuclear stress test 2007   Tobacco abuse     Past Surgical History:  Procedure Laterality Date   APPENDECTOMY     CARDIAC CATHETERIZATION     ROTATOR CUFF REPAIR Right    THYROIDECTOMY       Scheduled Meds:  aspirin EC  81 mg Oral QHS   ezetimibe  10 mg Oral QHS   levothyroxine  50 mcg Oral Daily   metoprolol succinate  25 mg Oral QHS   pantoprazole  40 mg Oral Daily   rosuvastatin   5 mg Oral Q M,W,F   Continuous Infusions:  ciprofloxacin 400 mg (09/04/23 0154)   metronidazole 500 mg (09/04/23 0153)   PRN Meds: acetaminophen **OR** acetaminophen, fluticasone, HYDROmorphone (DILAUDID) injection, ondansetron **OR** ondansetron (ZOFRAN) IV, traMADol  Allergies:    Allergies  Allergen Reactions   Valium Other (See Comments)    STOPS HEART!   Lipitor [Atorvastatin] Other (See Comments)  Body aches   Penicillins Other (See Comments)    Reaction from [employer] injection    Social History:   Social History   Socioeconomic History   Marital status: Married    Spouse name: Not on file   Number of children: Not on file   Years of education: Not on file   Highest education level: Not on file  Occupational History   Not on file  Tobacco Use   Smoking status: Former    Current packs/day: 1.00    Types: Cigarettes   Smokeless tobacco: Not on file  Vaping Use   Vaping status: Never Used   Substance and Sexual Activity   Alcohol use: No   Drug use: No   Sexual activity: Yes  Other Topics Concern   Not on file  Social History Narrative   Not on file   Social Drivers of Health   Financial Resource Strain: Not on file  Food Insecurity: No Food Insecurity (09/03/2023)   Hunger Vital Sign    Worried About Running Out of Food in the Last Year: Never true    Ran Out of Food in the Last Year: Never true  Transportation Needs: No Transportation Needs (09/03/2023)   PRAPARE - Administrator, Civil Service (Medical): No    Lack of Transportation (Non-Medical): No  Physical Activity: Not on file  Stress: Not on file  Social Connections: Socially Integrated (09/03/2023)   Social Connection and Isolation Panel [NHANES]    Frequency of Communication with Friends and Family: Three times a week    Frequency of Social Gatherings with Friends and Family: Twice a week    Attends Religious Services: 1 to 4 times per year    Active Member of [employer] or Organizations: Yes    Attends Banker Meetings: 1 to 4 times per year    Marital Status: Married  Catering manager Violence: Not At Risk (09/03/2023)   Humiliation, Afraid, Rape, and Kick questionnaire    Fear of Current or Ex-Partner: No    Emotionally Abused: No    Physically Abused: No    Sexually Abused: No    Family History:    Family History  Problem Relation Age of Onset   Coronary artery disease Father 98       CABG   Hypertension Father    Heart disease Father      ROS:  Please see the history of present illness.   All other ROS reviewed and negative.     Physical Exam/Data: Vitals:   09/03/23 2049 09/04/23 0147 09/04/23 0619 09/04/23 0917  BP: 133/67 111/66 (!) 118/59 (!) 143/68  Pulse: 66 61 (!) 57 64  Resp: 16 16 17    Temp: 98.8 F (37.1 C) 98.8 F (37.1 C) 98.7 F (37.1 C) 98.1 F (36.7 C)  TempSrc: Oral Oral Oral Oral  SpO2: 96% 97% 95% 98%  Weight:      Height:         Intake/Output Summary (Last 24 hours) at 09/04/2023 1010 Last data filed at 09/04/2023 1610 Gross per 24 hour  Intake 1745.1 ml  Output --  Net 1745.1 ml      09/02/2023   11:44 PM 11/22/2011   10:48 AM 01/26/2011    8:06 AM  Last 3 Weights  Weight (lbs) 180 lb 180 lb 12.8 oz 178 lb  Weight (kg) 81.647 kg 82.01 kg 80.74 kg     Body mass index is 25.1 kg/m.  General:  Well nourished, well developed, in no acute distress.  Laying flat in the bed HEENT: normal Neck: no JVD Cardiac:  normal S1, S2; RRR; no murmur  Lungs:  clear to auscultation bilaterally, no wheezing, rhonchi or rales.  Normal work of breathing on room air Ext: no edema Musculoskeletal:  No deformities Skin: warm and dry  Neuro:  CNs 2-12 intact, no focal abnormalities noted Psych:  Normal affect   EKG:  The EKG was personally reviewed and demonstrates:  EKG pending today  Telemetry:  Telemetry was personally reviewed and demonstrates:  NA  Relevant CV Studies:   Laboratory Data: High Sensitivity Troponin:  No results for input(s): "TROPONINIHS" in the last 720 hours.   Chemistry Recent Labs  Lab 09/02/23 2354 09/03/23 1114 09/04/23 0417  NA 140 137 136  K 3.9 3.8 4.4  CL 102 106 104  CO2 23 22 24   GLUCOSE 167* 99 104*  BUN 18 15 10   CREATININE 1.42* 0.99 1.19  CALCIUM  9.3 8.6* 8.4*  MG  --  2.0  --   GFRNONAA 53* >60 >60  ANIONGAP 15 9 8     Recent Labs  Lab 09/02/23 2354 09/03/23 1114 09/04/23 0417  PROT 7.3 6.1* 5.7*  ALBUMIN 4.4 3.4* 3.2*  AST 20 22 18   ALT 12 13 11   ALKPHOS 79 57 52  BILITOT 0.3 0.9 1.0   Lipids No results for input(s): "CHOL", "TRIG", "HDL", "LABVLDL", "LDLCALC", "CHOLHDL" in the last 168 hours.  Hematology Recent Labs  Lab 09/02/23 2354 09/03/23 1114 09/04/23 0417  WBC 9.7 9.3 7.3  RBC 4.51 4.21* 4.02*  HGB 14.5 13.5 12.7*  HCT 42.8 41.9 40.7  MCV 94.9 99.5 101.2*  MCH 32.2 32.1 31.6  MCHC 33.9 32.2 31.2  RDW 13.5 13.6 13.6  PLT 330 274 217    Thyroid No results for input(s): "TSH", "FREET4" in the last 168 hours.  BNPNo results for input(s): "BNP", "PROBNP" in the last 168 hours.  DDimer No results for input(s): "DDIMER" in the last 168 hours.  Radiology/Studies:  US  Abdomen Limited RUQ (LIVER/GB) Result Date: 09/03/2023 CLINICAL DATA:  8 hours history epigastric pain, nausea. Gallstones noted on CT today. EXAM: ULTRASOUND ABDOMEN LIMITED RIGHT UPPER QUADRANT COMPARISON:  CT with IV contrast earlier today. FINDINGS: Gallbladder: There are stones and sludge in the gallbladder, largest visible stone 5 mm. There is a thickened free wall measuring 6.8 mm, trace pericholecystic fluid. There is no positive sonographic Murphy sign, but the patient has received pain medication and the Murphy's sign could be suppressed. Common bile duct: Diameter: 3.4 mm.  No intrahepatic bile duct dilatation. Liver: No focal lesion identified. Within normal limits in parenchymal echogenicity. Portal vein is patent on color Doppler imaging with normal direction of blood flow towards the liver. Other: None. IMPRESSION: 1. Cholelithiasis with thickened free wall and trace pericholecystic fluid. No positive sonographic Murphy sign, but the patient has received pain medication and the Murphy's sign could be suppressed. Other findings are suspicious for cholecystitis. 2. No biliary ductal dilatation. Electronically Signed   By: Denman Fischer M.D.   On: 09/03/2023 06:31   CT ABDOMEN PELVIS W CONTRAST Result Date: 09/03/2023 EXAM: CT ABDOMEN AND PELVIS WITH CONTRAST 09/03/2023 01:00:00 AM TECHNIQUE: CT of the abdomen and pelvis was performed with the administration of 100mL iohexol (OMNIPAQUE) 300 MG/ML solution. Multiplanar reformatted images are provided for review. Automated exposure control, iterative reconstruction, and/or weight based adjustment of the mA/kV was utilized to reduce the radiation dose to  as low as reasonably achievable. COMPARISON: CTA abdomen / pelvis  dated 11/14/2003. CLINICAL HISTORY: Abdominal pain, acute, nonlocalized. Pt c/o right sided abd with some nausea and emesis x 1 starting 2 hours ago, pt reports gall stone on a MRI about a year ago, denies fever, diarrhea, urinary symptoms. FINDINGS: LOWER CHEST: Median sternotomy. LIVER: Subcentimeter cyst in segment 4a (image 12). GALLBLADDER AND BILE DUCTS: Small gallstones, without associated inflammatory changes. SPLEEN: No acute abnormality. PANCREAS: No acute abnormality. ADRENAL GLANDS: No acute abnormality. KIDNEYS, URETERS AND BLADDER: No stones in the kidneys or ureters. No hydronephrosis. No perinephric or periureteral stranding. Urinary bladder is unremarkable. GI AND BOWEL: The appendix is not completely visualized, likely surgically absent. No bowel obstruction. PERITONEUM AND RETROPERITONEUM: No ascites. No free air. VASCULATURE: Atherosclerotic calcifications of the abdominal aorta and branch vessels. LYMPH NODES: No lymphadenopathy. REPRODUCTIVE ORGANS: No acute abnormality. BONES AND SOFT TISSUES: Small fat-containing right inguinal hernia. Degenerative changes of the visualized thoracolumbar spine. IMPRESSION: 1. Small gallstones without associated inflammatory changes. 2. Small fat-containing right inguinal hernia. Electronically signed by: Zadie Herter MD 09/03/2023 01:11 AM EDT RP Workstation: [id number]     Assessment and Plan:  Preoperative evaluation CAD s/p CABG - Patient followed by [facility].  Previously had CABG in 11/2021 with LIMA-LAD, SVG-RCA, SVG-proximal circumflex.  Echocardiogram at that time showed EF 55-60% - Patient reports that he has overall been doing well from a cardiac standpoint.  Denies chest pain, shortness of breath, palpitations, syncope, near syncope.  He is able to do yard work, go up and down stairs, do housework without chest pain or shortness of breath - As patient is able to complete greater than 4 METS of physical activity without  symptoms, no indication for further cardiac evaluation prior to surgery - EKG pending today - Okay to hold Plavix perioperatively.  Continue aspirin 81 mg daily.  Patient questions why he remains on aspirin and Plavix since he is >1 year out from his CABG and NSTEMI. Discussed that he can likely be transitioned to monotherapy, but will defer to his primary cardiologist at [facility]   Hypertension - Continue metoprolol supinate 25 mg daily  Hyperlipidemia  - Continue Crestor  5 mg 3 times per week and Zetia.  He has had side effects with higher doses of statins in the past - Managed by primary cardiologist through [facility]  Otherwise per primary - Cholelithiasis - Hypothyroidism  Risk Assessment/Risk Scores:  For questions or updates, please contact [facility] Please consult [email or web address] for contact info under  Signed, Debria Fang, PA-C  09/04/2023 10:10 AM  Patient seen and examined, note reviewed with the signed Advanced Practice Provider. I personally reviewed laboratory data, imaging studies and relevant notes. I independently examined the patient and formulated the important aspects of the plan. I have personally discussed the plan with the patient and/or family. Comments or changes to the note/plan are indicated below.  Patient seen and examined at his bedside. His wife was at the bedside.  No complaints at this time - looking forward to his upcoming procedure.   CAD s/p CABG Hypertension  Hyperlipidemia Preoperative evaluation  Agree with holding Plavix as we plan for his procedure.  Thankfully no anginal symptoms.  Blood pressure is at target.   The patient does not have any unstable cardiac conditions.  Upon evaluation today, he can achieve 4 METs or greater without anginal symptoms.  According to [facility] and AHA guidelines, herequires no further cardiac workup prior to his  noncardiac surgery and should be at acceptable risk.  Our service is available as necessary  in the perioperative period.   Nyemah Watton DO, MS [facility] Attending Cardiologist [facility]  [address] [place], [place] [address] [phone] Website: [email or web address]

## 2023-09-04 NOTE — Progress Notes (Signed)
 PROGRESS NOTE    [name]  [name] DOB: 11-14-1955 DOA: 09/02/2023 PCP: Patient, No Pcp Per   Brief Narrative:  Samuel Wall is a 68 y.o. male with medical history significant of tobacco abuse in remission, CAD s/p CABG, family history ischemic heart disease, hypertension, hyperlipidemia who presented to the emergency with complaints of right upper quadrant pain associated with nausea and emesis.  Assessment & Plan:   Principal Problem:   RUQ pain Active Problems:   HTN (hypertension)   Hypercholesteremia   Tobacco abuse   CAD (coronary artery disease)   Cholelithiasis   Hypothyroidism   Intractable RUQ pain secondary to acute calculus cholecystitis, POA - General Surgery following, appreciate insight and recommendations -Plan for intervention in the next 48 hours once Plavix is washed out as below - N.p.o. at midnight on 09/05/2023  CAD (coronary artery disease) -Cardiology requested preop evaluation Continue aspirin. Hold clopidogrel. Continue statin and beta-blocker.   HTN (hypertension) -Continue metoprolol 25 mg p.o. bedtime.   Hypercholesteremia Continue rosuvastatin  5 mg p.o. 3 times a week. Continue ezetimibe 10 mg p.o. bedtime.   Hypothyroidism  Continue levothyroxine 50 mcg p.o. daily.   Tobacco abuse Currently in remission.  DVT prophylaxis: SCDs Start: 09/03/23 0743, early ambulation Code Status:   Code Status: Full Code Family Communication: Wife at bedside  Status is: Inpatient  Dispo: The patient is from: Home              Anticipated d/c is to: Home              Anticipated d/c date is: 72+ hours              Patient currently not medically stable for discharge  Consultants:  Cardiology, general surgery  Procedures:  Tentatively planned surgical intervention 09/06/2023  Antimicrobials:  Perioperatively  Subjective: No acute issues or events overnight tolerating p.o. quite well denies nausea vomiting diarrhea  constipation headache fever chills chest pain  Objective: Vitals:   09/04/23 0147 09/04/23 0619 09/04/23 0917 09/04/23 1420  BP: 111/66 (!) 118/59 (!) 143/68 122/66  Pulse: 61 (!) 57 64 65  Resp: 16 17    Temp: 98.8 F (37.1 C) 98.7 F (37.1 C) 98.1 F (36.7 C) 99.1 F (37.3 C)  TempSrc: Oral Oral Oral Oral  SpO2: 97% 95% 98% 97%  Weight:      Height:        Intake/Output Summary (Last 24 hours) at 09/04/2023 1843 Last data filed at 09/04/2023 1800 Gross per 24 hour  Intake 1540 ml  Output --  Net 1540 ml   Filed Weights   09/02/23 2344  Weight: 81.6 kg    Examination:  General:  Pleasantly resting in bed, No acute distress. HEENT:  Normocephalic atraumatic.  Sclerae nonicteric, noninjected.  Extraocular movements intact bilaterally. Neck:  Without mass or deformity.  Trachea is midline. Lungs:  Clear to auscultate bilaterally without rhonchi, wheeze, or rales. Heart:  Regular rate and rhythm.  Without murmurs, rubs, or gallops. Abdomen:  Soft, minimally tender RUQ, nondistended.  Without guarding or rebound. Extremities: Without cyanosis, clubbing, edema, or obvious deformity. Skin:  Warm and dry, no erythema.  Data Reviewed: I have personally reviewed following labs and imaging studies  CBC: Recent Labs  Lab 09/02/23 2354 09/03/23 1114 09/04/23 0417  WBC 9.7 9.3 7.3  NEUTROABS 5.9  --   --   HGB 14.5 13.5 12.7*  HCT 42.8 41.9 40.7  MCV 94.9 99.5 101.2*  PLT  330 274 217   Basic Metabolic Panel: Recent Labs  Lab 09/02/23 2354 09/03/23 1114 09/04/23 0417  NA 140 137 136  K 3.9 3.8 4.4  CL 102 106 104  CO2 23 22 24   GLUCOSE 167* 99 104*  BUN 18 15 10   CREATININE 1.42* 0.99 1.19  CALCIUM  9.3 8.6* 8.4*  MG  --  2.0  --    GFR: Estimated Creatinine Clearance: 61.5 mL/min (by C-G formula based on SCr of 1.19 mg/dL). Liver Function Tests: Recent Labs  Lab 09/02/23 2354 09/03/23 1114 09/04/23 0417  AST 20 22 18   ALT 12 13 11   ALKPHOS 79 57 52   BILITOT 0.3 0.9 1.0  PROT 7.3 6.1* 5.7*  ALBUMIN 4.4 3.4* 3.2*   Recent Labs  Lab 09/02/23 2354  LIPASE 34   No results for input(s): "AMMONIA" in the last 168 hours. Coagulation Profile: No results for input(s): "INR", "PROTIME" in the last 168 hours. Cardiac Enzymes: No results for input(s): "CKTOTAL", "CKMB", "CKMBINDEX", "TROPONINI" in the last 168 hours. BNP (last 3 results) No results for input(s): "PROBNP" in the last 8760 hours. HbA1C: No results for input(s): "HGBA1C" in the last 72 hours. CBG: No results for input(s): "GLUCAP" in the last 168 hours. Lipid Profile: No results for input(s): "CHOL", "HDL", "LDLCALC", "TRIG", "CHOLHDL", "LDLDIRECT" in the last 72 hours. Thyroid Function Tests: No results for input(s): "TSH", "T4TOTAL", "FREET4", "T3FREE", "THYROIDAB" in the last 72 hours. Anemia Panel: No results for input(s): "VITAMINB12", "FOLATE", "FERRITIN", "TIBC", "IRON", "RETICCTPCT" in the last 72 hours. Sepsis Labs: No results for input(s): "PROCALCITON", "LATICACIDVEN" in the last 168 hours.  No results found for this or any previous visit (from the past 240 hours).       Radiology Studies: US  Abdomen Limited RUQ (LIVER/GB) Result Date: 09/03/2023 CLINICAL DATA:  8 hours history epigastric pain, nausea. Gallstones noted on CT today. EXAM: ULTRASOUND ABDOMEN LIMITED RIGHT UPPER QUADRANT COMPARISON:  CT with IV contrast earlier today. FINDINGS: Gallbladder: There are stones and sludge in the gallbladder, largest visible stone 5 mm. There is a thickened free wall measuring 6.8 mm, trace pericholecystic fluid. There is no positive sonographic Murphy sign, but the patient has received pain medication and the Murphy's sign could be suppressed. Common bile duct: Diameter: 3.4 mm.  No intrahepatic bile duct dilatation. Liver: No focal lesion identified. Within normal limits in parenchymal echogenicity. Portal vein is patent on color Doppler imaging with normal direction  of blood flow towards the liver. Other: None. IMPRESSION: 1. Cholelithiasis with thickened free wall and trace pericholecystic fluid. No positive sonographic Murphy sign, but the patient has received pain medication and the Murphy's sign could be suppressed. Other findings are suspicious for cholecystitis. 2. No biliary ductal dilatation. Electronically Signed   By: Denman Fischer M.D.   On: 09/03/2023 06:31   CT ABDOMEN PELVIS W CONTRAST Result Date: 09/03/2023 EXAM: CT ABDOMEN AND PELVIS WITH CONTRAST 09/03/2023 01:00:00 AM TECHNIQUE: CT of the abdomen and pelvis was performed with the administration of 100mL iohexol (OMNIPAQUE) 300 MG/ML solution. Multiplanar reformatted images are provided for review. Automated exposure control, iterative reconstruction, and/or weight based adjustment of the mA/kV was utilized to reduce the radiation dose to as low as reasonably achievable. COMPARISON: CTA abdomen / pelvis dated 11/14/2003. CLINICAL HISTORY: Abdominal pain, acute, nonlocalized. Pt c/o right sided abd with some nausea and emesis x 1 starting 2 hours ago, pt reports gall stone on a MRI about a year ago, denies fever, diarrhea, urinary  symptoms. FINDINGS: LOWER CHEST: Median sternotomy. LIVER: Subcentimeter cyst in segment 4a (image 12). GALLBLADDER AND BILE DUCTS: Small gallstones, without associated inflammatory changes. SPLEEN: No acute abnormality. PANCREAS: No acute abnormality. ADRENAL GLANDS: No acute abnormality. KIDNEYS, URETERS AND BLADDER: No stones in the kidneys or ureters. No hydronephrosis. No perinephric or periureteral stranding. Urinary bladder is unremarkable. GI AND BOWEL: The appendix is not completely visualized, likely surgically absent. No bowel obstruction. PERITONEUM AND RETROPERITONEUM: No ascites. No free air. VASCULATURE: Atherosclerotic calcifications of the abdominal aorta and branch vessels. LYMPH NODES: No lymphadenopathy. REPRODUCTIVE ORGANS: No acute abnormality. BONES AND SOFT  TISSUES: Small fat-containing right inguinal hernia. Degenerative changes of the visualized thoracolumbar spine. IMPRESSION: 1. Small gallstones without associated inflammatory changes. 2. Small fat-containing right inguinal hernia. Electronically signed by: Zadie Herter MD 09/03/2023 01:11 AM EDT RP Workstation: [id number]    Scheduled Meds:  aspirin EC  81 mg Oral QHS   ezetimibe  10 mg Oral QHS   levothyroxine  50 mcg Oral Daily   metoprolol succinate  25 mg Oral QHS   pantoprazole  40 mg Oral Daily   rosuvastatin   5 mg Oral Q M,W,F   Continuous Infusions:  ciprofloxacin 400 mg (09/04/23 1253)   metronidazole 500 mg (09/04/23 1255)     LOS: 1 day   Time spent:  Haydee Lipa, DO [facility]  If 7PM-7AM, please contact night-coverage [email or web address]  09/04/2023, 6:43 PM

## 2023-09-04 NOTE — Progress Notes (Addendum)
 [facility] Progress Note     Subjective: CC:  RUQ pain continues - controlled with IV dilaudid every 4 hours. Denies nausea, vomiting, diarrhea. Denies chest pain, SOB, DOE. States he is not as active as he used to be but is able to work in the yard for up to 4 hours without chest pain.  Reports hx open appendectomy in the 1960's. Confirms LD plavix 5/31 Objective: Vital signs in last 24 hours: Temp:  [98.4 F (36.9 C)-99.6 F (37.6 C)] 98.7 F (37.1 C) (06/02 0619) Pulse Rate:  [57-67] 57 (06/02 0619) Resp:  [14-17] 17 (06/02 0619) BP: (111-140)/(59-69) 118/59 (06/02 0619) SpO2:  [95 %-98 %] 95 % (06/02 0619) Last BM Date : 09/02/23  Intake/Output from previous day: 06/01 0701 - 06/02 0700 In: 1896.8 [P.O.:780; I.V.:732.6; IV Piggyback:384.2] Out: -  Intake/Output this shift: No intake/output data recorded.  PE: Gen:  Alert, NAD, pleasant Card:  Regular rate and rhythm Pulm:  Normal effort ORA Abd: Soft, TTP RUQ without guarding, no hernias or masses warm and dry, no rashes  Psych: A&Ox3   Lab Results:  Recent Labs    09/03/23 1114 09/04/23 0417  WBC 9.3 7.3  HGB 13.5 12.7*  HCT 41.9 40.7  PLT 274 217   BMET Recent Labs    09/03/23 1114 09/04/23 0417  NA 137 136  K 3.8 4.4  CL 106 104  CO2 22 24  GLUCOSE 99 104*  BUN 15 10  CREATININE 0.99 1.19  CALCIUM  8.6* 8.4*   PT/INR No results for input(s): "LABPROT", "INR" in the last 72 hours. CMP     Component Value Date/Time   NA 136 09/04/2023 0417   K 4.4 09/04/2023 0417   CL 104 09/04/2023 0417   CO2 24 09/04/2023 0417   GLUCOSE 104 (H) 09/04/2023 0417   BUN 10 09/04/2023 0417   CREATININE 1.19 09/04/2023 0417   CALCIUM  8.4 (L) 09/04/2023 0417   PROT 5.7 (L) 09/04/2023 0417   ALBUMIN 3.2 (L) 09/04/2023 0417   AST 18 09/04/2023 0417   ALT 11 09/04/2023 0417   ALKPHOS 52 09/04/2023 0417   BILITOT 1.0 09/04/2023 0417   GFRNONAA >60 09/04/2023 0417   Lipase     Component Value  Date/Time   LIPASE 34 09/02/2023 2354       Studies/Results: US  Abdomen Limited RUQ (LIVER/GB) Result Date: 09/03/2023 CLINICAL DATA:  8 hours history epigastric pain, nausea. Gallstones noted on CT today. EXAM: ULTRASOUND ABDOMEN LIMITED RIGHT UPPER QUADRANT COMPARISON:  CT with IV contrast earlier today. FINDINGS: Gallbladder: There are stones and sludge in the gallbladder, largest visible stone 5 mm. There is a thickened free wall measuring 6.8 mm, trace pericholecystic fluid. There is no positive sonographic Murphy sign, but the patient has received pain medication and the Murphy's sign could be suppressed. Common bile duct: Diameter: 3.4 mm.  No intrahepatic bile duct dilatation. Liver: No focal lesion identified. Within normal limits in parenchymal echogenicity. Portal vein is patent on color Doppler imaging with normal direction of blood flow towards the liver. Other: None. IMPRESSION: 1. Cholelithiasis with thickened free wall and trace pericholecystic fluid. No positive sonographic Murphy sign, but the patient has received pain medication and the Murphy's sign could be suppressed. Other findings are suspicious for cholecystitis. 2. No biliary ductal dilatation. Electronically Signed   By: Denman Fischer M.D.   On: 09/03/2023 06:31   CT ABDOMEN PELVIS W CONTRAST Result Date: 09/03/2023 EXAM: CT ABDOMEN AND PELVIS WITH CONTRAST  09/03/2023 01:00:00 AM TECHNIQUE: CT of the abdomen and pelvis was performed with the administration of iohexol (OMNIPAQUE) 300 MG/ML solution. Multiplanar reformatted images are provided for review. Automated exposure control, iterative reconstruction, and/or weight based adjustment of the mA/kV was utilized to reduce the radiation dose to as low as reasonably achievable. COMPARISON: CTA abdomen / pelvis dated 11/14/2003. CLINICAL HISTORY: Abdominal pain, acute, nonlocalized. Pt c/o right sided abd with some nausea and emesis x 1 starting 2 hours ago, pt reports gall  stone on a MRI about a year ago, denies fever, diarrhea, urinary symptoms. FINDINGS: LOWER CHEST: Median sternotomy. LIVER: Subcentimeter cyst in segment 4a (image 12). GALLBLADDER AND BILE DUCTS: Small gallstones, without associated inflammatory changes. SPLEEN: No acute abnormality. PANCREAS: No acute abnormality. ADRENAL GLANDS: No acute abnormality. KIDNEYS, URETERS AND BLADDER: No stones in the kidneys or ureters. No hydronephrosis. No perinephric or periureteral stranding. Urinary bladder is unremarkable. GI AND BOWEL: The appendix is not completely visualized, likely surgically absent. No bowel obstruction. PERITONEUM AND RETROPERITONEUM: No ascites. No free air. VASCULATURE: Atherosclerotic calcifications of the abdominal aorta and branch vessels. LYMPH NODES: No lymphadenopathy. REPRODUCTIVE ORGANS: No acute abnormality. BONES AND SOFT TISSUES: Small fat-containing right inguinal hernia. Degenerative changes of the visualized thoracolumbar spine. IMPRESSION: 1. Small gallstones without associated inflammatory changes. 2. Small fat-containing right inguinal hernia. Electronically signed by: Zadie Herter MD 09/03/2023 01:11 AM EDT RP Workstation: [id number]    Anti-infectives: Anti-infectives (From admission, onward)    Start     Dose/Rate Route Frequency Ordered Stop   09/03/23 1300  ciprofloxacin (CIPRO) IVPB 400 mg        400 mg 200 mL/hr over 60 Minutes Intravenous Every 12 hours 09/03/23 1210     09/03/23 1300  metroNIDAZOLE (FLAGYL) IVPB 500 mg        500 mg 100 mL/hr over 60 Minutes Intravenous Every 12 hours 09/03/23 1210          Assessment/Plan Acute calculous cholecystitis   - afebrile, WBC 7.3, LFTs and lipase are WNL - RUQ U/S shows cholelithiasis, wall thickening, pericholecystitic fluid - I have consulted cardiology for perioperative risk stratification and optimization and appreciate their care. Pt without sxs of ACS during my exam, appears euvolemic.  - last dose  plavix, 5/31. Tentative plan for cholecystectomy on Wednesday 6/4.   FEN: SOFT ID: cipro/flagyl VTE: SCD's, ok for chemical VTE ppx from a ccs standpoint Foley: none Dispo: med-surg  CAD s/p CABG  HTN HLD   LOS: 1 day   I reviewed nursing notes, hospitalist notes, last 24 h vitals and pain scores, last 48 h intake and output, last 24 h labs and trends, and last 24 h imaging results.  This care required moderate level of medical decision making.   Michial Akin, PA-C [facility] Please see Amion for pager number during day hours 7:00am-4:30pm

## 2023-09-04 NOTE — Progress Notes (Signed)
   09/04/23 1004  TOC Brief Assessment  Insurance and Status Reviewed  Patient has primary care physician Yes  Home environment has been reviewed home w/ family  Prior level of function: mod independent  Prior/Current Home Services No current home services  Social Drivers of Health Review SDOH reviewed no interventions necessary  Readmission risk has been reviewed Yes  Transition of care needs no transition of care needs at this time

## 2023-09-05 DIAGNOSIS — I2510 Atherosclerotic heart disease of native coronary artery without angina pectoris: Secondary | ICD-10-CM | POA: Diagnosis not present

## 2023-09-05 DIAGNOSIS — Z01810 Encounter for preprocedural cardiovascular examination: Secondary | ICD-10-CM | POA: Diagnosis not present

## 2023-09-05 DIAGNOSIS — E785 Hyperlipidemia, unspecified: Secondary | ICD-10-CM | POA: Diagnosis not present

## 2023-09-05 DIAGNOSIS — I10 Essential (primary) hypertension: Secondary | ICD-10-CM | POA: Diagnosis not present

## 2023-09-05 DIAGNOSIS — R1011 Right upper quadrant pain: Secondary | ICD-10-CM | POA: Diagnosis not present

## 2023-09-05 NOTE — Progress Notes (Signed)
 [facility] Progress Note     Subjective: CC:  Abd pain improved, moved more toward back.   Reports hx open appendectomy in the 1960's. Confirms LD plavix 5/31 Objective: Vital signs in last 24 hours: Temp:  [98.1 F (36.7 C)-99.5 F (37.5 C)] 99.5 F (37.5 C) (06/03 1259) Pulse Rate:  [51-69] 62 (06/03 1259) Resp:  [16-18] 16 (06/03 1259) BP: (123-141)/(62-76) 138/76 (06/03 1259) SpO2:  [96 %-99 %] 99 % (06/03 1259) Last BM Date : 09/03/23  Intake/Output from previous day: 06/02 0701 - 06/03 0700 In: 2595.8 [P.O.:1780; IV Piggyback:815.8] Out: -  Intake/Output this shift: No intake/output data recorded.  PE: Gen:  Alert, NAD, pleasant Card:  Regular rate and rhythm Pulm:  no distress.  Abd: Soft, TTP RUQ without guarding, no hernias or masses warm and dry, no rashes  Psych: A&Ox3   Lab Results:  Recent Labs    09/03/23 1114 09/04/23 0417  WBC 9.3 7.3  HGB 13.5 12.7*  HCT 41.9 40.7  PLT 274 217   BMET Recent Labs    09/03/23 1114 09/04/23 0417  NA 137 136  K 3.8 4.4  CL 106 104  CO2 22 24  GLUCOSE 99 104*  BUN 15 10  CREATININE 0.99 1.19  CALCIUM  8.6* 8.4*   PT/INR No results for input(s): "LABPROT", "INR" in the last 72 hours. CMP     Component Value Date/Time   NA 136 09/04/2023 0417   K 4.4 09/04/2023 0417   CL 104 09/04/2023 0417   CO2 24 09/04/2023 0417   GLUCOSE 104 (H) 09/04/2023 0417   BUN 10 09/04/2023 0417   CREATININE 1.19 09/04/2023 0417   CALCIUM  8.4 (L) 09/04/2023 0417   PROT 5.7 (L) 09/04/2023 0417   ALBUMIN 3.2 (L) 09/04/2023 0417   AST 18 09/04/2023 0417   ALT 11 09/04/2023 0417   ALKPHOS 52 09/04/2023 0417   BILITOT 1.0 09/04/2023 0417   GFRNONAA >60 09/04/2023 0417   Lipase     Component Value Date/Time   LIPASE 34 09/02/2023 2354       Studies/Results: No results found.   Anti-infectives: Anti-infectives (From admission, onward)    Start     Dose/Rate Route Frequency Ordered Stop    09/03/23 1300  ciprofloxacin (CIPRO) IVPB 400 mg        400 mg 200 mL/hr over 60 Minutes Intravenous Every 12 hours 09/03/23 1210     09/03/23 1300  metroNIDAZOLE (FLAGYL) IVPB 500 mg        500 mg 100 mL/hr over 60 Minutes Intravenous Every 12 hours 09/03/23 1210          Assessment/Plan Acute calculous cholecystitis   - RUQ U/S shows cholelithiasis, wall thickening, pericholecystitic fluid - I have consulted cardiology for perioperative risk stratification and optimization and appreciate their care. Pt without sxs of ACS during my exam, appears euvolemic.  - last dose plavix, 5/31. Tentative plan for cholecystectomy on Wednesday 6/4.   Reviewed risks/benefits of surgery. Discussed possibility of bumping  FEN: SOFT ID: cipro/flagyl VTE: SCD's, ok for chemical VTE ppx from a ccs standpoint Foley: none Dispo: med-surg  CAD s/p CABG  HTN HLD   LOS: 2 days   I reviewed nursing notes, hospitalist notes, last 24 h vitals and pain scores, last 48 h intake and output, last 24 h labs and trends, and last 24 h imaging results.  This care required moderate level of medical decision making.  Deliliah Fender, MD, FACS, FSSO  Surgical Oncology, General Surgery, Trauma and Critical Care [facility], [place] [phone] for weekday/non holidays Check [email or web address] for coverage night/weekend/holidays

## 2023-09-05 NOTE — Plan of Care (Signed)
   Problem: Clinical Measurements: Goal: Ability to maintain clinical measurements within normal limits will improve Outcome: Progressing Goal: Will remain free from infection Outcome: Progressing

## 2023-09-05 NOTE — Progress Notes (Addendum)
 Progress Note  Patient Name: Samuel Wall Date of Encounter: 09/05/2023  Primary Cardiologist: None   Subjective   Patient seen and examined at his bedside.  Plan for his surgery is tomorrow morning.  He offers no complaints at this time.  Inpatient Medications    Scheduled Meds:  aspirin EC  81 mg Oral QHS   ezetimibe  10 mg Oral QHS   levothyroxine  50 mcg Oral Daily   metoprolol succinate  25 mg Oral QHS   pantoprazole  40 mg Oral Daily   rosuvastatin   5 mg Oral Q M,W,F   Continuous Infusions:  ciprofloxacin 400 mg (09/05/23 1251)   metronidazole 500 mg (09/05/23 1249)   PRN Meds: acetaminophen **OR** acetaminophen, fluticasone, ondansetron **OR** ondansetron (ZOFRAN) IV, traMADol   Vital Signs    Vitals:   09/04/23 1420 09/04/23 2053 09/05/23 0601 09/05/23 1259  BP: 122/66 (!) 141/73 123/62 138/76  Pulse: 65 69 (!) 51 62  Resp:  18 18 16   Temp: 99.1 F (37.3 C) 98.5 F (36.9 C) 98.1 F (36.7 C) 99.5 F (37.5 C)  TempSrc: Oral Oral Oral Oral  SpO2: 97% 96% 96% 99%  Weight:      Height:        Intake/Output Summary (Last 24 hours) at 09/05/2023 1356 Last data filed at 09/05/2023 1257 Gross per 24 hour  Intake 2400 ml  Output --  Net 2400 ml   Filed Weights   09/02/23 2344  Weight: 81.6 kg    Telemetry    Sinus rhythm - Personally Reviewed  ECG     - Personally Reviewed  Physical Exam    General: Comfortable Head: Atraumatic, normal size  Eyes: PEERLA, EOMI  Neck: Supple, normal JVD Cardiac: Normal S1, S2; RRR; no murmurs, rubs, or gallops Lungs: Clear to auscultation bilaterally Abd: Soft, nontender, no hepatomegaly  Ext: warm, no edema Musculoskeletal: No deformities, BUE and BLE strength normal and equal Skin: Warm and dry, no rashes   Neuro: Alert and oriented to person, place, time, and situation, CNII-XII grossly intact, no focal deficits  Psych: Normal mood and affect   Labs    Chemistry Recent Labs  Lab 09/02/23 2354  09/03/23 1114 09/04/23 0417  NA 140 137 136  K 3.9 3.8 4.4  CL 102 106 104  CO2 23 22 24   GLUCOSE 167* 99 104*  BUN 18 15 10   CREATININE 1.42* 0.99 1.19  CALCIUM  9.3 8.6* 8.4*  PROT 7.3 6.1* 5.7*  ALBUMIN 4.4 3.4* 3.2*  AST 20 22 18   ALT 12 13 11   ALKPHOS 79 57 52  BILITOT 0.3 0.9 1.0  GFRNONAA 53* >60 >60  ANIONGAP 15 9 8      Hematology Recent Labs  Lab 09/02/23 2354 09/03/23 1114 09/04/23 0417  WBC 9.7 9.3 7.3  RBC 4.51 4.21* 4.02*  HGB 14.5 13.5 12.7*  HCT 42.8 41.9 40.7  MCV 94.9 99.5 101.2*  MCH 32.2 32.1 31.6  MCHC 33.9 32.2 31.2  RDW 13.5 13.6 13.6  PLT 330 274 217    Cardiac EnzymesNo results for input(s): "TROPONINI" in the last 168 hours. No results for input(s): "TROPIPOC" in the last 168 hours.   BNPNo results for input(s): "BNP", "PROBNP" in the last 168 hours.   DDimer No results for input(s): "DDIMER" in the last 168 hours.   Radiology    No results found.  Cardiac Studies     Patient Profile     68 y.o. male with history  of coronary artery disease status post PCI to the RCA in 2015, then in 2023 underwent coronary artery bypass grafting with LIMA to LAD, SVG to RCA, SVG to proximal circumflex, hyperlipidemia presented with intractable right upper quadrant pain secondary to acute calculus cholecystitis pending surgery  Assessment & Plan    Coronary artery disease-no anginal symptoms at this time.  Continue to hold his Plavix until the postoperative.  And once okay with the surgical team will restart his Plavix.  Continue aspirin.  Continue his statin and beta-blocker as well.  Hypertension-blood pressure is at goal continue current regimen including Toprol 25 mg daily.  Hyperlipidemia he is on rosuvastatin  as well as Zetia.  Tobacco use - continued cessation advised.   The patient does not have any unstable cardiac conditions.  Upon evaluation today, he can achieve 4 METs or greater without anginal symptoms.  According to [facility] and AHA  guidelines, he requires no further cardiac workup prior to his  noncardiac surgery and should be at acceptable risk.  Our service is available as necessary in the perioperative period.  For questions or updates, please contact [facility] Please consult [email or web address] for contact info under Cardiology/STEMI.      Signed, Kahiau Schewe, DO  09/05/2023, 1:56 PM

## 2023-09-05 NOTE — Progress Notes (Signed)
 PROGRESS NOTE    Samuel ARNOTT  VWU:[id number] DOB: 04-24-1955 DOA: 09/02/2023 PCP: Patient, No Pcp Per   Brief Narrative:  Samuel Wall is a 68 y.o. male with medical history significant of tobacco abuse in remission, CAD s/p CABG, family history ischemic heart disease, hypertension, hyperlipidemia who presented to the emergency with complaints of right upper quadrant pain associated with nausea and emesis.  Assessment & Plan:   Principal Problem:   RUQ pain Active Problems:   HTN (hypertension)   Hypercholesteremia   Tobacco abuse   CAD (coronary artery disease)   Cholelithiasis   Hypothyroidism   Intractable RUQ pain secondary to acute calculus cholecystitis, POA - General Surgery following, appreciate insight and recommendations - Abdominal pain/distention somewhat worse today - recommending transition to full liquid if unable to tolerate soft diet - Plan for intervention in the next 24-48 hours once Plavix is washed out as below - N.p.o. at midnight on 09/05/2023  CAD (coronary artery disease) - Cardiology requested preop evaluation - Continue aspirin; Hold clopidogrel - Continue statin and beta-blocker.   HTN (hypertension) - Continue metoprolol succ 25 mg p.o. bedtime.   Hypercholesteremia/lipidemia Continue rosuvastatin  5 mg p.o. 3 times a week. Continue ezetimibe 10 mg p.o. bedtime.   Hypothyroidism  Continue levothyroxine 50 mcg p.o. daily.   Tobacco abuse Currently in remission.  DVT prophylaxis: SCDs Start: 09/03/23 0743, early ambulation Code Status:   Code Status: Full Code Family Communication: Wife at bedside  Status is: Inpatient  Dispo: The patient is from: Home              Anticipated d/c is to: Home              Anticipated d/c date is: 72+ hours              Patient currently not medically stable for discharge  Consultants:  Cardiology, general surgery  Procedures:  Tentatively planned surgical intervention  09/06/2023  Antimicrobials:  Perioperatively  Subjective: No acute issues or events overnight tolerating p.o. this am but having residual bloating/pain with diet - denies nausea vomiting diarrhea constipation headache fever chills chest pain  Objective: Vitals:   09/04/23 0917 09/04/23 1420 09/04/23 2053 09/05/23 0601  BP: (!) 143/68 122/66 (!) 141/73 123/62  Pulse: 64 65 69 (!) 51  Resp:   18 18  Temp: 98.1 F (36.7 C) 99.1 F (37.3 C) 98.5 F (36.9 C) 98.1 F (36.7 C)  TempSrc: Oral Oral Oral Oral  SpO2: 98% 97% 96% 96%  Weight:      Height:        Intake/Output Summary (Last 24 hours) at 09/05/2023 0717 Last data filed at 09/05/2023 0600 Gross per 24 hour  Intake 2595.8 ml  Output --  Net 2595.8 ml   Filed Weights   09/02/23 2344  Weight: 81.6 kg    Examination:  General:  Pleasantly resting in bed, No acute distress. HEENT:  Normocephalic atraumatic.  Sclerae nonicteric, noninjected.  Extraocular movements intact bilaterally. Neck:  Without mass or deformity.  Trachea is midline. Lungs:  Clear to auscultate bilaterally without rhonchi, wheeze, or rales. Heart:  Regular rate and rhythm.  Without murmurs, rubs, or gallops. Abdomen: Minimally tender RUQ, minimally distended.  Without guarding or rebound. Extremities: Without cyanosis, clubbing, edema, or obvious deformity. Skin:  Warm and dry, no erythema.  Data Reviewed: I have personally reviewed following labs and imaging studies  CBC: Recent Labs  Lab 09/02/23 2354 09/03/23 1114 09/04/23 0417  WBC 9.7 9.3 7.3  NEUTROABS 5.9  --   --   HGB 14.5 13.5 12.7*  HCT 42.8 41.9 40.7  MCV 94.9 99.5 101.2*  PLT 330 274 217   Basic Metabolic Panel: Recent Labs  Lab 09/02/23 2354 09/03/23 1114 09/04/23 0417  NA 140 137 136  K 3.9 3.8 4.4  CL 102 106 104  CO2 23 22 24   GLUCOSE 167* 99 104*  BUN 18 15 10   CREATININE 1.42* 0.99 1.19  CALCIUM  9.3 8.6* 8.4*  MG  --  2.0  --    GFR: Estimated Creatinine  Clearance: 61.5 mL/min (by C-G formula based on SCr of 1.19 mg/dL). Liver Function Tests: Recent Labs  Lab 09/02/23 2354 09/03/23 1114 09/04/23 0417  AST 20 22 18   ALT 12 13 11   ALKPHOS 79 57 52  BILITOT 0.3 0.9 1.0  PROT 7.3 6.1* 5.7*  ALBUMIN 4.4 3.4* 3.2*   Recent Labs  Lab 09/02/23 2354  LIPASE 34    Radiology Studies: No results found.   Scheduled Meds:  aspirin EC  81 mg Oral QHS   ezetimibe  10 mg Oral QHS   levothyroxine  50 mcg Oral Daily   metoprolol succinate  25 mg Oral QHS   pantoprazole  40 mg Oral Daily   rosuvastatin   5 mg Oral Q M,W,F   Continuous Infusions:  ciprofloxacin 400 mg (09/05/23 0034)   metronidazole 500 mg (09/05/23 0033)     LOS: 2 days   Time spent:  Haydee Lipa, DO [facility]  If 7PM-7AM, please contact night-coverage [email or web address]  09/05/2023, 7:17 AM

## 2023-09-06 ENCOUNTER — Inpatient Hospital Stay (HOSPITAL_COMMUNITY): Admitting: Registered Nurse

## 2023-09-06 ENCOUNTER — Encounter (HOSPITAL_COMMUNITY)
Admission: EM | Disposition: A | Discharge status: Home / Self Care | Payer: Self-pay | Source: Home / Self Care | Attending: Internal Medicine

## 2023-09-06 ENCOUNTER — Other Ambulatory Visit: Payer: Self-pay

## 2023-09-06 ENCOUNTER — Encounter (HOSPITAL_COMMUNITY): Payer: Self-pay | Admitting: Internal Medicine

## 2023-09-06 DIAGNOSIS — R1011 Right upper quadrant pain: Secondary | ICD-10-CM | POA: Diagnosis not present

## 2023-09-06 DIAGNOSIS — I2510 Atherosclerotic heart disease of native coronary artery without angina pectoris: Secondary | ICD-10-CM

## 2023-09-06 DIAGNOSIS — I10 Essential (primary) hypertension: Secondary | ICD-10-CM

## 2023-09-06 DIAGNOSIS — K819 Cholecystitis, unspecified: Secondary | ICD-10-CM | POA: Diagnosis not present

## 2023-09-06 DIAGNOSIS — E039 Hypothyroidism, unspecified: Secondary | ICD-10-CM

## 2023-09-06 HISTORY — PX: CHOLECYSTECTOMY: SHX55

## 2023-09-06 LAB — CBC
HCT: 43.4 % (ref 39.0–52.0)
Hemoglobin: 13.9 g/dL (ref 13.0–17.0)
MCH: 32.3 pg (ref 26.0–34.0)
MCHC: 32 g/dL (ref 30.0–36.0)
MCV: 100.9 fL — ABNORMAL HIGH (ref 80.0–100.0)
Platelets: 234 10*3/uL (ref 150–400)
RBC: 4.3 MIL/uL (ref 4.22–5.81)
RDW: 13.4 % (ref 11.5–15.5)
WBC: 7.6 10*3/uL (ref 4.0–10.5)
nRBC: 0 % (ref 0.0–0.2)

## 2023-09-06 LAB — BASIC METABOLIC PANEL WITH GFR
Anion gap: 10 (ref 5–15)
BUN: 11 mg/dL (ref 8–23)
CO2: 22 mmol/L (ref 22–32)
Calcium: 8.4 mg/dL — ABNORMAL LOW (ref 8.9–10.3)
Chloride: 103 mmol/L (ref 98–111)
Creatinine, Ser: 1.12 mg/dL (ref 0.61–1.24)
GFR, Estimated: >60 mL/min (ref >60)
Glucose, Bld: 87 mg/dL (ref 70–99)
Potassium: 4.6 mmol/L (ref 3.5–5.1)
Sodium: 135 mmol/L (ref 135–145)

## 2023-09-06 SURGERY — LAPAROSCOPIC CHOLECYSTECTOMY WITH INTRAOPERATIVE CHOLANGIOGRAM
Anesthesia: General

## 2023-09-06 MED ORDER — ROCURONIUM BROMIDE 10 MG/ML (PF) SYRINGE
PREFILLED_SYRINGE | INTRAVENOUS | Status: AC
  Filled 2023-09-06: qty 10

## 2023-09-06 MED ORDER — ACETAMINOPHEN 500 MG PO TABS
1000.0000 mg | ORAL_TABLET | Freq: Three times a day (TID) | ORAL | Status: DC
  Administered 2023-09-06 – 2023-09-07 (×3): 1000 mg via ORAL
  Filled 2023-09-06 (×3): qty 2

## 2023-09-06 MED ORDER — INDOCYANINE GREEN 25 MG IV SOLR
2.5000 mg | INTRAVENOUS | Status: AC
  Administered 2023-09-06: 2.5 mg via INTRAVENOUS
  Filled 2023-09-06: qty 10

## 2023-09-06 MED ORDER — HYDROMORPHONE HCL 1 MG/ML IJ SOLN
INTRAMUSCULAR | Status: AC
  Filled 2023-09-06: qty 1

## 2023-09-06 MED ORDER — ROCURONIUM BROMIDE 10 MG/ML (PF) SYRINGE
PREFILLED_SYRINGE | INTRAVENOUS | Status: DC | PRN
Start: 2023-09-06 — End: 2023-09-06
  Administered 2023-09-06: 10 mg via INTRAVENOUS
  Administered 2023-09-06: 50 mg via INTRAVENOUS

## 2023-09-06 MED ORDER — ACETAMINOPHEN 500 MG PO TABS
1000.0000 mg | ORAL_TABLET | Freq: Once | ORAL | Status: AC
  Administered 2023-09-06: 1000 mg via ORAL
  Filled 2023-09-06: qty 2

## 2023-09-06 MED ORDER — HYDROMORPHONE HCL 1 MG/ML IJ SOLN
0.5000 mg | Freq: Four times a day (QID) | INTRAMUSCULAR | Status: DC | PRN
  Administered 2023-09-06: 0.5 mg via INTRAVENOUS
  Administered 2023-09-07: 1 mg via INTRAVENOUS
  Filled 2023-09-06 (×3): qty 1

## 2023-09-06 MED ORDER — TRAMADOL HCL 50 MG PO TABS
50.0000 mg | ORAL_TABLET | Freq: Four times a day (QID) | ORAL | Status: DC | PRN
  Administered 2023-09-06: 50 mg via ORAL
  Administered 2023-09-07: 100 mg via ORAL
  Filled 2023-09-06 (×2): qty 2

## 2023-09-06 MED ORDER — LIDOCAINE HCL (PF) 2 % IJ SOLN
INTRAMUSCULAR | Status: AC
  Filled 2023-09-06: qty 5

## 2023-09-06 MED ORDER — DEXAMETHASONE SODIUM PHOSPHATE 10 MG/ML IJ SOLN
INTRAMUSCULAR | Status: AC
  Filled 2023-09-06: qty 1

## 2023-09-06 MED ORDER — OXYCODONE HCL 5 MG PO TABS: 5.0000 mg | ORAL_TABLET | Freq: Once | ORAL | Status: DC | PRN

## 2023-09-06 MED ORDER — FENTANYL CITRATE PF 50 MCG/ML IJ SOSY
PREFILLED_SYRINGE | INTRAMUSCULAR | Status: AC
  Filled 2023-09-06: qty 1

## 2023-09-06 MED ORDER — PROPOFOL 10 MG/ML IV BOLUS
INTRAVENOUS | Status: DC | PRN
  Administered 2023-09-06: 100 mg via INTRAVENOUS

## 2023-09-06 MED ORDER — OXYCODONE HCL 5 MG/5ML PO SOLN: 5.0000 mg | Freq: Once | ORAL | Status: DC | PRN

## 2023-09-06 MED ORDER — LIDOCAINE HCL (PF) 1 % IJ SOLN
INTRAMUSCULAR | Status: AC
  Filled 2023-09-06: qty 30

## 2023-09-06 MED ORDER — ONDANSETRON HCL 4 MG/2ML IJ SOLN
INTRAMUSCULAR | Status: DC | PRN
Start: 2023-09-06 — End: 2023-09-06
  Administered 2023-09-06: 4 mg via INTRAVENOUS

## 2023-09-06 MED ORDER — LACTATED RINGERS IV SOLN: INTRAVENOUS | Status: DC

## 2023-09-06 MED ORDER — LACTATED RINGERS IR SOLN
Status: DC | PRN
  Administered 2023-09-06: 1000 mL

## 2023-09-06 MED ORDER — EPHEDRINE 5 MG/ML INJ
INTRAVENOUS | Status: AC
Start: 2023-09-06 — End: ?
  Filled 2023-09-06: qty 5

## 2023-09-06 MED ORDER — METHOCARBAMOL 500 MG PO TABS
500.0000 mg | ORAL_TABLET | Freq: Four times a day (QID) | ORAL | Status: DC | PRN
  Administered 2023-09-06: 500 mg via ORAL
  Filled 2023-09-06: qty 1

## 2023-09-06 MED ORDER — LIDOCAINE 2% (20 MG/ML) 5 ML SYRINGE
INTRAMUSCULAR | Status: DC | PRN
Start: 2023-09-06 — End: 2023-09-06
  Administered 2023-09-06: 80 mg via INTRAVENOUS

## 2023-09-06 MED ORDER — LIDOCAINE HCL (PF) 1 % IJ SOLN
INTRAMUSCULAR | Status: DC | PRN
  Administered 2023-09-06: 10 mL

## 2023-09-06 MED ORDER — CHLORHEXIDINE GLUCONATE 0.12 % MT SOLN
15.0000 mL | Freq: Once | OROMUCOSAL | Status: AC
  Administered 2023-09-06: 15 mL via OROMUCOSAL

## 2023-09-06 MED ORDER — FENTANYL CITRATE PF 50 MCG/ML IJ SOSY
25.0000 ug | PREFILLED_SYRINGE | INTRAMUSCULAR | Status: DC | PRN
  Administered 2023-09-06 (×3): 50 ug via INTRAVENOUS

## 2023-09-06 MED ORDER — SUGAMMADEX SODIUM 200 MG/2ML IV SOLN
INTRAVENOUS | Status: DC | PRN
Start: 2023-09-06 — End: 2023-09-06
  Administered 2023-09-06: 200 mg via INTRAVENOUS

## 2023-09-06 MED ORDER — METRONIDAZOLE 500 MG/100ML IV SOLN
INTRAVENOUS | Status: DC | PRN
  Administered 2023-09-06: 500 mg via INTRAVENOUS

## 2023-09-06 MED ORDER — DEXAMETHASONE SODIUM PHOSPHATE 10 MG/ML IJ SOLN
INTRAMUSCULAR | Status: DC | PRN
  Administered 2023-09-06: 8 mg via INTRAVENOUS

## 2023-09-06 MED ORDER — ONDANSETRON HCL 4 MG/2ML IJ SOLN
INTRAMUSCULAR | Status: AC
  Filled 2023-09-06: qty 2

## 2023-09-06 MED ORDER — EPHEDRINE SULFATE-NACL 50-0.9 MG/10ML-% IV SOSY
PREFILLED_SYRINGE | INTRAVENOUS | Status: DC | PRN
Start: 2023-09-06 — End: 2023-09-06
  Administered 2023-09-06 (×2): 5 mg via INTRAVENOUS

## 2023-09-06 MED ORDER — FENTANYL CITRATE PF 50 MCG/ML IJ SOSY
PREFILLED_SYRINGE | INTRAMUSCULAR | Status: AC
Start: 2023-09-06 — End: ?
  Filled 2023-09-06: qty 2

## 2023-09-06 MED ORDER — BUPIVACAINE-EPINEPHRINE (PF) 0.25% -1:200000 IJ SOLN
INTRAMUSCULAR | Status: DC | PRN
  Administered 2023-09-06: 10 mL

## 2023-09-06 MED ORDER — FENTANYL CITRATE (PF) 100 MCG/2ML IJ SOLN
INTRAMUSCULAR | Status: DC | PRN
  Administered 2023-09-06 (×5): 50 ug via INTRAVENOUS

## 2023-09-06 MED ORDER — BUPIVACAINE-EPINEPHRINE (PF) 0.25% -1:200000 IJ SOLN
INTRAMUSCULAR | Status: AC
  Filled 2023-09-06: qty 30

## 2023-09-06 MED ORDER — OXYCODONE HCL 5 MG PO TABS: 5.0000 mg | ORAL_TABLET | ORAL | Status: DC | PRN

## 2023-09-06 MED ORDER — HYDROMORPHONE HCL 1 MG/ML IJ SOLN
0.2500 mg | INTRAMUSCULAR | Status: DC | PRN
  Administered 2023-09-06 (×2): 0.5 mg via INTRAVENOUS

## 2023-09-06 MED ORDER — AMISULPRIDE (ANTIEMETIC) 5 MG/2ML IV SOLN: 10.0000 mg | Freq: Once | INTRAVENOUS | Status: DC | PRN

## 2023-09-06 MED ORDER — PHENYLEPHRINE 80 MCG/ML (10ML) SYRINGE FOR IV PUSH (FOR BLOOD PRESSURE SUPPORT)
PREFILLED_SYRINGE | INTRAVENOUS | Status: DC | PRN
  Administered 2023-09-06 (×2): 80 ug via INTRAVENOUS

## 2023-09-06 MED ORDER — FENTANYL CITRATE (PF) 250 MCG/5ML IJ SOLN
INTRAMUSCULAR | Status: AC
  Filled 2023-09-06: qty 5

## 2023-09-06 SURGICAL SUPPLY — 33 items
BAG COUNTER SPONGE SURGICOUNT (BAG) IMPLANT
CABLE HIGH FREQUENCY MONO STRZ (ELECTRODE) IMPLANT
CHLORAPREP W/TINT 26 (MISCELLANEOUS) ×1 IMPLANT
CLIP APPLIE ROT 10 11.4 M/L (STAPLE) ×1 IMPLANT
CLIP LIGATING HEMO O LOK GREEN (MISCELLANEOUS) IMPLANT
COVER MAYO STAND XLG (MISCELLANEOUS) ×1 IMPLANT
COVER SURGICAL LIGHT HANDLE (MISCELLANEOUS) ×1 IMPLANT
DERMABOND ADVANCED .7 DNX12 (GAUZE/BANDAGES/DRESSINGS) IMPLANT
DRAPE C-ARM 42X120 X-RAY (DRAPES) IMPLANT
ELECT REM PT RETURN 15FT ADLT (MISCELLANEOUS) ×1 IMPLANT
GLOVE BIO SURGEON STRL SZ 6 (GLOVE) ×1 IMPLANT
GLOVE INDICATOR 6.5 STRL GRN (GLOVE) ×1 IMPLANT
GOWN STRL REUS W/ TWL XL LVL3 (GOWN DISPOSABLE) ×1 IMPLANT
HEMOSTAT SNOW SURGICEL 2X4 (HEMOSTASIS) IMPLANT
IRRIGATION SUCT STRKRFLW 2 WTP (MISCELLANEOUS) ×1 IMPLANT
KIT BASIN OR (CUSTOM PROCEDURE TRAY) ×1 IMPLANT
KIT IMAGING PINPOINTPAQ (MISCELLANEOUS) IMPLANT
KIT TURNOVER KIT A (KITS) IMPLANT
LHOOK LAP DISP 36CM (ELECTROSURGICAL) ×1 IMPLANT
PENCIL SMOKE EVACUATOR (MISCELLANEOUS) ×1 IMPLANT
POUCH RETRIEVAL ECOSAC 10 (ENDOMECHANICALS) ×1 IMPLANT
SCISSORS LAP 5X35 DISP (ENDOMECHANICALS) ×1 IMPLANT
SET CHOLANGIOGRAPH MIX (MISCELLANEOUS) IMPLANT
SET TUBE SMOKE EVAC HIGH FLOW (TUBING) ×1 IMPLANT
SLEEVE Z-THREAD 5X100MM (TROCAR) ×1 IMPLANT
SPIKE FLUID TRANSFER (MISCELLANEOUS) ×1 IMPLANT
SUT MNCRL AB 4-0 PS2 18 (SUTURE) ×1 IMPLANT
SYSTEM BAG RETRIEVAL 10MM (BASKET) ×1 IMPLANT
TOWEL OR 17X26 10 PK STRL BLUE (TOWEL DISPOSABLE) ×1 IMPLANT
TRAY LAPAROSCOPIC (CUSTOM PROCEDURE TRAY) ×1 IMPLANT
TROCAR 11X100 Z THREAD (TROCAR) ×1 IMPLANT
TROCAR BALLN 12MMX100 BLUNT (TROCAR) ×1 IMPLANT
TROCAR Z-THREAD OPTICAL 5X100M (TROCAR) ×1 IMPLANT

## 2023-09-06 NOTE — Anesthesia Preprocedure Evaluation (Addendum)
 Anesthesia Evaluation  Patient identified by MRN, date of birth, ID band Patient awake    Reviewed: Allergy & Precautions, NPO status , Patient's Chart, lab work & pertinent test results, reviewed documented beta blocker date and time   Airway Mallampati: II  TM Distance: >3 FB Neck ROM: Full    Dental no notable dental hx. (+) Dental Advisory Given, Chipped   Pulmonary former smoker   Pulmonary exam normal breath sounds clear to auscultation       Cardiovascular hypertension, Pt. on medications and Pt. on home beta blockers + CAD and + CABG  Normal cardiovascular exam Rhythm:Regular Rate:Normal  CABG in 11/2021 with LIMA-LAD, SVG-RCA, SVG-proximal circumflex.  Echocardiogram at that time showed EF 55-60%   Neuro/Psych negative neurological ROS  negative psych ROS   GI/Hepatic negative GI ROS, Neg liver ROS,,,  Endo/Other  Hypothyroidism    Renal/GU negative Renal ROS  negative genitourinary   Musculoskeletal negative musculoskeletal ROS (+)    Abdominal   Peds  Hematology  (+) Blood dyscrasia (plavix)   Anesthesia Other Findings   Reproductive/Obstetrics                             Anesthesia Physical Anesthesia Plan  ASA: 3  Anesthesia Plan: General   Post-op Pain Management: Tylenol PO (pre-op)*   Induction: Intravenous  PONV Risk Score and Plan: 2 and Dexamethasone, Ondansetron and Treatment may vary due to age or medical condition  Airway Management Planned: Oral ETT  Additional Equipment:   Intra-op Plan:   Post-operative Plan: Extubation in OR  Informed Consent: I have reviewed the patients History and Physical, chart, labs and discussed the procedure including the risks, benefits and alternatives for the proposed anesthesia with the patient or authorized representative who has indicated his/her understanding and acceptance.     Dental advisory given  Plan Discussed  with: CRNA  Anesthesia Plan Comments:        Anesthesia Quick Evaluation

## 2023-09-06 NOTE — Anesthesia Procedure Notes (Signed)
 Procedure Name: Intubation Date/Time: 09/06/2023 11:04 AM  Performed by: Marshall Skeeter, CRNAPre-anesthesia Checklist: Patient identified, Emergency Drugs available, Suction available, Patient being monitored and Timeout performed Patient Re-evaluated:Patient Re-evaluated prior to induction Oxygen Delivery Method: Circle system utilized Preoxygenation: Pre-oxygenation with 100% oxygen Induction Type: IV induction Ventilation: Mask ventilation without difficulty and Oral airway inserted - appropriate to patient size Laryngoscope Size: Mac and 4 Grade View: Grade I Tube type: Oral Tube size: 7.5 mm Number of attempts: 1 Airway Equipment and Method: Stylet Placement Confirmation: ETT inserted through vocal cords under direct vision, positive ETCO2 and breath sounds checked- equal and bilateral Secured at: 22 cm Tube secured with: Tape Dental Injury: Teeth and Oropharynx as per pre-operative assessment

## 2023-09-06 NOTE — Progress Notes (Signed)
 [facility] Progress Note  * Day of Surgery *  Subjective: CC:  Abd pain was rated pain at 5/10, worse with movement. Patient found walking around room with life partner and daughter in room. Surgery scheduled for this AM. (6/4).   Reports hx open appendectomy in the 1960's. Confirms LD plavix 5/31 Objective: Vital signs in last 24 hours: Temp:  [98 F (36.7 C)-99.5 F (37.5 C)] 98 F (36.7 C) (06/04 0602) Pulse Rate:  [59-70] 70 (06/04 0602) Resp:  [16-18] 18 (06/04 0602) BP: (111-138)/(59-76) 131/63 (06/04 0602) SpO2:  [98 %-99 %] 98 % (06/04 0602) Last BM Date : 09/05/23  Intake/Output from previous day: 06/03 0701 - 06/04 0700 In: 2760 [P.O.:2160; IV Piggyback:600] Out: -  Intake/Output this shift: No intake/output data recorded.  PE: Gen:  Alert, NAD, pleasant Card:  Regular rate and rhythm Pulm:  no distress.  Abd: Remains soft, TTP RUQ without guarding, no hernias or masses warm and dry, no rashes  Psych: A&Ox3   Lab Results:  Recent Labs    09/04/23 0417 09/06/23 0442  WBC 7.3 7.6  HGB 12.7* 13.9  HCT 40.7 43.4  PLT 217 234   BMET Recent Labs    09/04/23 0417 09/06/23 0442  NA 136 135  K 4.4 4.6  CL 104 103  CO2 24 22  GLUCOSE 104* 87  BUN 10 11  CREATININE 1.19 1.12  CALCIUM  8.4* 8.4*   PT/INR No results for input(s): "LABPROT", "INR" in the last 72 hours. CMP     Component Value Date/Time   NA 135 09/06/2023 0442   K 4.6 09/06/2023 0442   CL 103 09/06/2023 0442   CO2 22 09/06/2023 0442   GLUCOSE 87 09/06/2023 0442   BUN 11 09/06/2023 0442   CREATININE 1.12 09/06/2023 0442   CALCIUM  8.4 (L) 09/06/2023 0442   PROT 5.7 (L) 09/04/2023 0417   ALBUMIN 3.2 (L) 09/04/2023 0417   AST 18 09/04/2023 0417   ALT 11 09/04/2023 0417   ALKPHOS 52 09/04/2023 0417   BILITOT 1.0 09/04/2023 0417   GFRNONAA >60 09/06/2023 0442   Lipase     Component Value Date/Time   LIPASE 34 09/02/2023 2354       Studies/Results: No  results found.   Anti-infectives: Anti-infectives (From admission, onward)    Start     Dose/Rate Route Frequency Ordered Stop   09/03/23 1300  ciprofloxacin (CIPRO) IVPB 400 mg        400 mg 200 mL/hr over 60 Minutes Intravenous Every 12 hours 09/03/23 1210     09/03/23 1300  metroNIDAZOLE (FLAGYL) IVPB 500 mg        500 mg 100 mL/hr over 60 Minutes Intravenous Every 12 hours 09/03/23 1210          Assessment/Plan Acute calculous cholecystitis   - RUQ U/S shows cholelithiasis, wall thickening, pericholecystitic fluid - Cardiology was consulted for perioperative risk stratification and optimization and appreciate their care. Pt has mod. Pain during my exam. Patient heading to OR today.  - last dose plavix, 5/31. Cholecystectomy on Wednesday, 6/4.    FEN: NPO at least until post-op ID: cipro/flagyl VTE: SCD's, ok for chemical VTE ppx from a [facility] standpoint Foley: none Dispo: med-surg post-op  CAD s/p CABG  HTN HLD   LOS: 3 days   I reviewed nursing notes, hospitalist notes, last 24 h vitals and pain scores, last 48 h intake and output, last 24 h labs and trends, and last 24 h  imaging results.  This care required moderate level of medical decision making.  Mirta Ammon, PA-C Surgical Oncology, General Surgery, Trauma and Critical [facility], [facility] Check [email or web address] for coverage night/weekend/holidays

## 2023-09-06 NOTE — Transfer of Care (Signed)
 Immediate Anesthesia Transfer of Care Note  Patient: Samuel Wall  Procedure(s) Performed: LAPAROSCOPIC CHOLECYSTECTOMY WITH ICG  Patient Location: PACU  Anesthesia Type:General  Level of Consciousness: awake, alert , oriented, and patient cooperative  Airway & Oxygen Therapy: Patient Spontanous Breathing and Patient connected to face mask oxygen  Post-op Assessment: Report given to RN, Post -op Vital signs reviewed and stable, and Patient moving all extremities  Post vital signs: Reviewed and stable  Last Vitals:  Vitals Value Taken Time  BP 159/78 09/06/23 1253  Temp 36.6 C 09/06/23 1253  Pulse 64 09/06/23 1254  Resp 13 09/06/23 1254  SpO2 98 % 09/06/23 1254  Vitals shown include unfiled device data.  Last Pain:  Vitals:   09/06/23 0915  TempSrc:   PainSc: 5       Patients Stated Pain Goal: 3 (09/03/23 0817)  Complications: No notable events documented.

## 2023-09-06 NOTE — Plan of Care (Signed)
  Problem: Clinical Measurements: Goal: Respiratory complications will improve Outcome: Progressing   Problem: Pain Managment: Goal: General experience of comfort will improve and/or be controlled Outcome: Progressing

## 2023-09-06 NOTE — Progress Notes (Signed)
 [facility] ROUNDING NOTE Samuel Wall WUJ:[id number]  DOB: 11/14/1955  DOA: 09/02/2023  PCP: Patient, No Pcp Per  09/06/2023,7:07 AM  LOS: 3 days    Code Status: Full code   from: Home current Dispo: Unclear   68 year old male HTN HLD prior tobacco CAD STEMI 2015 DES to RCA-NSTEMI 11/2021 with CABG follows at [facility] cardiology last seen 04/2023-is on Plavix  09/03/23% RUQ pain emesis-sodium 137 potassium 3.8 BUN/creatinine 15/0.9 WBC 9.3 hemoglobin 13.5 platelet 274 CBG slightly elevated RUQ US  cholelithiasis with thickened free wall trace pericholecystic fluid, CT ABD pelvis small gallstones without associated inflammatory changes small fat-containing right inguinal hernia Cardiology consulted for clearance, general surgery consulted   Plan  Acute cholecystitis Lap chole performed 6/4-still little bit sleepy-diet, pain control, postop care as per general surgery Continues Cipro Flagyl at this time duration determined by surgery Current Meds tramadol 50 every 6 as needed, Dilaudid 0.5-2 every 6 as needed, Robinul 500 every 6 as needed Currently on clear liquid diet  CAD DES 2015 CABG 2023 Resume aspirin 81 daily, Plavix to be resumed in the next 1 to 2 days Continue metoprolol XL 25 at bedtime, Zetia 10 at bedtime, Crestor  5 MWF  Hypothyroidism Continue Synthroid 50  Tobacco abuse Outpatient follow-up  DVT prophylaxis: SCD for now  Status is: Inpatient Remains inpatient appropriate because: Requires further stability      Subjective: Awake coherent but still little bit groggy Has not eaten Pain seems controlled  Objective + exam Vitals:   09/05/23 0601 09/05/23 1259 09/05/23 2125 09/06/23 0602  BP: 123/62 138/76 (!) 111/59 131/63  Pulse: (!) 51 62 (!) 59 70  Resp: 18 16 18 18   Temp: 98.1 F (36.7 C) 99.5 F (37.5 C) 98.5 F (36.9 C) 98 F (36.7 C)  TempSrc: Oral Oral Oral Oral  SpO2: 96% 99% 98% 98%  Weight:      Height:       Filed Weights   09/02/23 2344   Weight: 81.6 kg    Examination: EOMI NCAT no focal deficit no icterus no pallor Chest clear Abdomens soft no rebound no guarding postop changes noted No lower extremity edema Power 5/5   Data Reviewed: reviewed   CBC    Component Value Date/Time   WBC 7.6 09/06/2023 0442   RBC 4.30 09/06/2023 0442   HGB 13.9 09/06/2023 0442   HCT 43.4 09/06/2023 0442   PLT 234 09/06/2023 0442   MCV 100.9 (H) 09/06/2023 0442   MCH 32.3 09/06/2023 0442   MCHC 32.0 09/06/2023 0442   RDW 13.4 09/06/2023 0442   LYMPHSABS 2.8 09/02/2023 2354   MONOABS 0.7 09/02/2023 2354   EOSABS 0.4 09/02/2023 2354   BASOSABS 0.1 09/02/2023 2354      Latest Ref Rng & Units 09/06/2023    4:42 AM 09/04/2023    4:17 AM 09/03/2023   11:14 AM  CMP  Glucose 70 - 99 mg/dL 87  956  99   BUN 8 - 23 mg/dL 11  10  15    Creatinine 0.61 - 1.24 mg/dL 2.13  0.86  5.78   Sodium 135 - 145 mmol/L 135  136  137   Potassium 3.5 - 5.1 mmol/L 4.6  4.4  3.8   Chloride 98 - 111 mmol/L 103  104  106   CO2 22 - 32 mmol/L 22  24  22    Calcium  8.9 - 10.3 mg/dL 8.4  8.4  8.6   Total Protein 6.5 - 8.1 g/dL  5.7  6.1   Total Bilirubin 0.0 - 1.2 mg/dL  1.0  0.9   Alkaline Phos 38 - 126 U/L  52  57   AST 15 - 41 U/L  18  22   ALT 0 - 44 U/L  11  13     Scheduled Meds:  aspirin EC  81 mg Oral QHS   ezetimibe  10 mg Oral QHS   levothyroxine  50 mcg Oral Daily   metoprolol succinate  25 mg Oral QHS   pantoprazole  40 mg Oral Daily   rosuvastatin   5 mg Oral Q M,W,F   Continuous Infusions:  ciprofloxacin 400 mg (09/06/23 0025)   metronidazole 500 mg (09/06/23 0026)    Time  22  Verlie Glisson, MD  [facility]

## 2023-09-06 NOTE — Op Note (Signed)
 Laparoscopic Cholecystectomy with ICG cholangiography  Indications: This patient presents with acute calculous cholecystitis and will undergo laparoscopic cholecystectomy.  Pre-operative Diagnosis: acute calculous cholecystitis  Post-operative Diagnosis: Same  Surgeon: Lockie Rima   Assistants: Sharleen Dawley, PA-C  Anesthesia: General endotracheal anesthesia and local  ASA Class: 3  Procedure Details   The patient was seen again in the Holding Room. The risks, benefits, complications, treatment options, and expected outcomes were discussed with the patient. The possibilities of  bleeding, recurrent infection, damage to nearby structures, the need for additional procedures, failure to diagnose a condition, the possible need to convert to an open procedure, and creating a complication requiring transfusion or operation were discussed with the patient. The likelihood of improving the patient's symptoms with return to their baseline status is good.    The patient and/or family concurred with the proposed plan, giving informed consent. The site of surgery properly noted. The patient was taken to OR # 4 and placed supine on the OR table.  SCDs were placed. Antibiotic prophylaxis was administered. General endotracheal anesthesia was then administered and tolerated well. After the induction, the abdomen was prepped with Chloraprep and draped in the sterile fashion. and the procedure verified as Laparoscopic Cholecystectomy with ICG cholangiography. A Time Out was held and the above information confirmed.  Local anesthetic agent was injected into the skin near the umbilicus and an infraumbilical incision was made with a #11 blade. I dissected down to the abdominal fascia with blunt dissection.  The fascia was incised vertically and the peritoneal cavity was entered bluntly.  A pursestring suture of 0-Vicryl was placed around the fascial opening.  The Hasson cannula was inserted and secured with the stay  suture.  Pneumoperitoneum was then created with CO2 and tolerated well without any adverse changes in the patient's vital signs. An 11-mm port was placed in the subxiphoid position.  Two 5-mm ports were placed in the right upper quadrant. All skin incisions were infiltrated with a local anesthetic agent before making the incision and placing the trocars.   The patient was placed into reverse Trendelenburg position and was tilted slightly to the patient's left.  The gallbladder was identified, and there were minimal adhesions noted.  Adhesions were lysed bluntly and with the electrocautery where indicated, taking care not to injure any adjacent organs or viscus. The gallbladder required aspiration as it was very tense.   The fundus of the gallbladder was then grasped and retracted cephalad.  The infundibulum was grasped and retracted laterally, exposing the peritoneum overlying the triangle of Calot. The cystic duct and cystic artery were identified.  These were both skeletonized.  A window between the gallbladder and the liver was obtained to ensure a critical view.  The near IR light was used to confirm the identification of the cystic and common bile duct.     The cystic duct was ligated with a clip distally.  The cystic duct was then clipped once on the specimen side and thrice on the patient side.  The cystic artery was clipped once on the patient side and doubly clipped on the patient side.    The gallbladder was dissected from the liver bed in retrograde fashion with the electrocautery. The gallbladder was removed and placed in a retrieval bag.  The gallbladder and bag were then removed through the umbilical port site.  The liver bed was irrigated and inspected. Hemostasis was achieved with the electrocautery. Copious irrigation was utilized and was repeatedly aspirated until clear.  Pneumoperitoneum was released as we removed the trocars.   The pursestring suture was used to close the umbilical  fascia.  No residual palpable fascial defect was appreciated.   4-0 Monocryl was used to close the skin in subcuticular fashion.   The skin was cleaned and dry, and Dermabond was applied. The patient was then extubated and brought to the recovery room in stable condition. Instrument, sponge, and needle counts were correct at closure and at the conclusion of the case.   Findings: Acute calculous cholecystitis.    Estimated Blood Loss: min         Drains: none          Specimens: Gallbladder to pathology       Complications: None; patient tolerated the procedure well.         Disposition: PACU - hemodynamically stable.         Condition: stable

## 2023-09-07 ENCOUNTER — Encounter (HOSPITAL_COMMUNITY): Payer: Self-pay | Admitting: General Surgery

## 2023-09-07 DIAGNOSIS — R1011 Right upper quadrant pain: Secondary | ICD-10-CM | POA: Diagnosis not present

## 2023-09-07 LAB — BASIC METABOLIC PANEL WITH GFR
Anion gap: 8 (ref 5–15)
BUN: 9 mg/dL (ref 8–23)
CO2: 24 mmol/L (ref 22–32)
Calcium: 8.9 mg/dL (ref 8.9–10.3)
Chloride: 103 mmol/L (ref 98–111)
Creatinine, Ser: 1.07 mg/dL (ref 0.61–1.24)
GFR, Estimated: >60 mL/min (ref >60)
Glucose, Bld: 131 mg/dL — ABNORMAL HIGH (ref 70–99)
Potassium: 4.7 mmol/L (ref 3.5–5.1)
Sodium: 135 mmol/L (ref 135–145)

## 2023-09-07 LAB — CBC WITH DIFFERENTIAL/PLATELET
Abs Immature Granulocytes: 0.06 10*3/uL (ref 0.00–0.07)
Basophils Absolute: 0 10*3/uL (ref 0.0–0.1)
Basophils Relative: 0 %
Eosinophils Absolute: 0 10*3/uL (ref 0.0–0.5)
Eosinophils Relative: 0 %
HCT: 44.1 % (ref 39.0–52.0)
Hemoglobin: 14.3 g/dL (ref 13.0–17.0)
Immature Granulocytes: 1 %
Lymphocytes Relative: 6 %
Lymphs Abs: 0.7 10*3/uL (ref 0.7–4.0)
MCH: 31.9 pg (ref 26.0–34.0)
MCHC: 32.4 g/dL (ref 30.0–36.0)
MCV: 98.4 fL (ref 80.0–100.0)
Monocytes Absolute: 0.5 10*3/uL (ref 0.1–1.0)
Monocytes Relative: 4 %
Neutro Abs: 10.4 10*3/uL — ABNORMAL HIGH (ref 1.7–7.7)
Neutrophils Relative %: 89 %
Platelets: 315 10*3/uL (ref 150–400)
RBC: 4.48 MIL/uL (ref 4.22–5.81)
RDW: 13.3 % (ref 11.5–15.5)
WBC: 11.6 10*3/uL — ABNORMAL HIGH (ref 4.0–10.5)
nRBC: 0 % (ref 0.0–0.2)

## 2023-09-07 LAB — SURGICAL PATHOLOGY

## 2023-09-07 MED ORDER — OXYCODONE HCL 5 MG PO TABS: 2.5000 mg | ORAL_TABLET | ORAL | 0 refills | Status: AC | PRN

## 2023-09-07 MED ORDER — OXYCODONE HCL 5 MG PO TABS: 5.0000 mg | ORAL_TABLET | ORAL | Status: DC | PRN

## 2023-09-07 MED ORDER — OXYCODONE-ACETAMINOPHEN 5-325 MG PO TABS: 1.0000 | ORAL_TABLET | ORAL | 0 refills | Status: AC | PRN

## 2023-09-07 MED ORDER — OXYCODONE HCL 5 MG PO TABS: 2.5000 mg | ORAL_TABLET | ORAL | Status: DC | PRN

## 2023-09-07 MED ORDER — OXYCODONE-ACETAMINOPHEN 7.5-325 MG PO TABS: 1.0000 | ORAL_TABLET | ORAL | Status: DC | PRN

## 2023-09-07 MED ORDER — OXYCODONE-ACETAMINOPHEN 5-325 MG PO TABS
1.0000 | ORAL_TABLET | ORAL | Status: DC | PRN
  Filled 2023-09-07: qty 1

## 2023-09-07 MED ORDER — IBUPROFEN 400 MG PO TABS: 400.0000 mg | ORAL_TABLET | Freq: Four times a day (QID) | ORAL | Status: DC | PRN

## 2023-09-07 MED ORDER — IBUPROFEN 400 MG PO TABS: 400.0000 mg | ORAL_TABLET | Freq: Four times a day (QID) | ORAL | 0 refills | Status: AC | PRN

## 2023-09-07 MED ORDER — ACETAMINOPHEN 500 MG PO TABS: 1000.0000 mg | ORAL_TABLET | Freq: Three times a day (TID) | ORAL | Status: AC

## 2023-09-07 MED ORDER — METHOCARBAMOL 500 MG PO TABS: 500.0000 mg | ORAL_TABLET | Freq: Four times a day (QID) | ORAL | 0 refills | Status: AC | PRN

## 2023-09-07 NOTE — Progress Notes (Signed)
 1 Day Post-Op  Subjective: CC: Pain over RUQ incisions. Required IV pain medication overnight. Tolerating cld without n/v. Has not tried solid food. Mobilizing. Voiding. BM this am.   Afebrile. No tachycardia or hypotension. WBC 11.6. Hgb stable. No LFT's checked this am.   Objective: Vital signs in last 24 hours: Temp:  [97.3 F (36.3 C)-98.2 F (36.8 C)] 97.6 F (36.4 C) (06/05 0537) Pulse Rate:  [57-68] 63 (06/05 0537) Resp:  [10-20] 18 (06/05 0537) BP: (126-176)/(63-88) 140/81 (06/05 0537) SpO2:  [93 %-100 %] 96 % (06/05 0537) Weight:  [81.6 kg] 81.6 kg (06/04 0915) Last BM Date : 09/07/23  Intake/Output from previous day: 06/04 0701 - 06/05 0700 In: 3236.6 [P.O.:1320; I.V.:1316.5; IV Piggyback:600.1] Out: 1815 [Urine:1800; Blood:15] Intake/Output this shift: No intake/output data recorded.  PE: Gen:  Alert, NAD, pleasant Abd: Soft, mild distension, appropriately tender around laparoscopic incisions, no rigidity or guarding and otherwise NT, +BS. Incisions with glue intact appears well and are without drainage, bleeding, or signs of infection.   Lab Results:  Recent Labs    09/06/23 0442 09/07/23 0450  WBC 7.6 11.6*  HGB 13.9 14.3  HCT 43.4 44.1  PLT 234 315   BMET Recent Labs    09/06/23 0442 09/07/23 0450  NA 135 135  K 4.6 4.7  CL 103 103  CO2 22 24  GLUCOSE 87 131*  BUN 11 9  CREATININE 1.12 1.07  CALCIUM  8.4* 8.9   PT/INR No results for input(s): "LABPROT", "INR" in the last 72 hours. CMP     Component Value Date/Time   NA 135 09/07/2023 0450   K 4.7 09/07/2023 0450   CL 103 09/07/2023 0450   CO2 24 09/07/2023 0450   GLUCOSE 131 (H) 09/07/2023 0450   BUN 9 09/07/2023 0450   CREATININE 1.07 09/07/2023 0450   CALCIUM  8.9 09/07/2023 0450   PROT 5.7 (L) 09/04/2023 0417   ALBUMIN 3.2 (L) 09/04/2023 0417   AST 18 09/04/2023 0417   ALT 11 09/04/2023 0417   ALKPHOS 52 09/04/2023 0417   BILITOT 1.0 09/04/2023 0417   GFRNONAA >60  09/07/2023 0450   Lipase     Component Value Date/Time   LIPASE 34 09/02/2023 2354    Studies/Results: No results found.  Anti-infectives: Anti-infectives (From admission, onward)    Start     Dose/Rate Route Frequency Ordered Stop   09/03/23 1300  ciprofloxacin (CIPRO) IVPB 400 mg        400 mg 200 mL/hr over 60 Minutes Intravenous Every 12 hours 09/03/23 1210     09/03/23 1300  metroNIDAZOLE (FLAGYL) IVPB 500 mg        500 mg 100 mL/hr over 60 Minutes Intravenous Every 12 hours 09/03/23 1210          Assessment/Plan POD 1 s/p laparoscopic cholecystectomy by Dr. Cherlynn Cornfield on 09/06/23 for acute calculous cholecystitis - Adv diet - Will discuss w/ MD duration of abx post op  - Mobilize, pulm toilet - If pain well controlled w/ po medications and tolerates diet advancement, okay for d/c from our standpoint this afternoon. Okay to restart asa/plavix at d/c. Will arrange f/u and send pain meds to pharmacy. Discussed discharge instructions, restrictions and return/call back precautions.   FEN - HH VTE - SCDs, okay for chem ppx from a general surgery standpoint, asa ID - Cipro/Flagyl   LOS: 4 days    Delton Filbert, [facility] 09/07/2023, 8:30 AM Please see Amion for pager  number during day hours 7:00am-4:30pm

## 2023-09-07 NOTE — Care Management Important Message (Signed)
 Important Message  Patient Details IM Letter given. Name: Samuel Wall MRN: [id number] Date of Birth: 05/26/1955   Important Message Given:  Yes - Medicare IM     Anjela Cassara 09/07/2023, 11:28 AM

## 2023-09-07 NOTE — Plan of Care (Signed)

## 2023-09-07 NOTE — Anesthesia Postprocedure Evaluation (Signed)
 Anesthesia Post Note  Patient: Samuel Wall  Procedure(s) Performed: LAPAROSCOPIC CHOLECYSTECTOMY WITH ICG     Patient location during evaluation: PACU Anesthesia Type: General Level of consciousness: awake and alert Pain management: pain level controlled Vital Signs Assessment: post-procedure vital signs reviewed and stable Respiratory status: spontaneous breathing, nonlabored ventilation, respiratory function stable and patient connected to nasal cannula oxygen Cardiovascular status: blood pressure returned to baseline and stable Postop Assessment: no apparent nausea or vomiting Anesthetic complications: no  No notable events documented.  Last Vitals:  Vitals:   09/07/23 0537 09/07/23 1005  BP: (!) 140/81 (!) 139/105  Pulse: 63 68  Resp: 18 18  Temp: 36.4 C 37 C  SpO2: 96% 99%    Last Pain:  Vitals:   09/07/23 1005  TempSrc: Oral  PainSc:                  Aastha Dayley L Jazalyn Mondor

## 2023-09-07 NOTE — Discharge Instructions (Signed)

## 2023-09-07 NOTE — Discharge Summary (Signed)
 Physician Discharge Summary  HAIRO GARRAWAY ZOX:[id number] DOB: [birth year] DOA: 09/02/2023  PCP: Patient, No Pcp Per  Admit date: 09/02/2023 Discharge date: 09/07/2023  Time spent: 42 minutes  Recommendations for Outpatient Follow-up:  Needs Chem-12 CBC 1 week Outpatient follow-up with general surgery with weight precautions warning signs etc. 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 follows at [facility] cardiology last seen 04/2023-is on Plavix   09/03/23% RUQ pain emesis-sodium 137 potassium 3.8 BUN/creatinine 15/0.9 WBC 9.3 hemoglobin 13.5 platelet 274 CBG slightly elevated RUQ US  cholelithiasis with thickened free wall trace pericholecystic fluid, CT ABD pelvis small gallstones without associated inflammatory changes small fat-containing right inguinal hernia Cardiology consulted for clearance, general surgery consulted     Plan   Acute cholecystitis Lap chole performed 6/4-pain not completely controlled despite Tylenol 1000 every 8 therefore added ibuprofen 400 with the caveat to take with food every 6-8 hours--- Percocet 5-10 mg every 4 as needed added and tramadol discontinued Tolerating diet at discharge   CAD DES 2015 CABG 2023 Resume aspirin 81 daily, Plavix to be resumed at the discharge Continue metoprolol XL 25 at bedtime, Zetia 10 at bedtime, Crestor  5 MWF   Hypothyroidism Continue Synthroid 50-outpatient TFTs   Tobacco abuse Outpatient follow-up    Discharge Exam: Vitals:   09/07/23 0158 09/07/23 0537  BP: 126/63 (!)  140/81  Pulse: 62 63  Resp: 18 18  Temp: 98.2 F (36.8 C) 97.6 F (36.4 C)  SpO2: 97% 96%    Subj on day of d/c   Awake coherent some discomfort in abdomens pain about 7/10 Tolerated dinner last night no fever no chills, passing gas  General Exam on discharge  EOMI NCAT no focal deficit no icterus no pallor no wheeze no rales no rhonchi S1-S2 no murmur Chest clear Abdo menance slightly tender postop scars look good   Discharge Instructions   Discharge Instructions     Diet - low sodium heart healthy   Complete by: As directed    Discharge instructions   Complete by: As directed    Postop wound instructions and lifting as well as pain control as per general surgery-start with Tylenol around-the-clock schedule ibuprofen every 6-8 hours at the dose that we have given you for pain about 3/10 and for pain above 5 or 6/10 take Percocet Follow-up with general surgery for postop check Resume all your other medications For high-grade fevers nausea vomiting chills etc. please contact the emergency room or go there immediately Get labs in a week at primary care   Increase activity slowly   Complete by: As directed       Allergies as of 09/07/2023       Reactions   Valium Other (See Comments)   STOPS HEART!   Lipitor [atorvastatin] Other (See Comments)   Body aches   Penicillins Other (See Comments)   Reaction from military injection        Medication List     TAKE these medications    acetaminophen 500 MG tablet Commonly known as: TYLENOL Take 2  tablets (1,000 mg total) by mouth 3 (three) times daily.   aspirin 81 MG tablet Take 81 mg by mouth at bedtime.   clopidogrel 75 MG tablet Commonly known as: PLAVIX Take 75 mg by mouth at bedtime.   ezetimibe 10 MG tablet Commonly known as: ZETIA Take 10 mg by mouth at bedtime.   fluticasone 50 MCG/ACT nasal spray Commonly known as: FLONASE Place 2 sprays into both nostrils as needed for allergies.    GLUCOSAMINE PO Take 1 tablet by mouth in the morning and at bedtime.   ibuprofen 400 MG tablet Commonly known as: ADVIL Take 1 tablet (400 mg total) by mouth every 6 (six) hours as needed for mild pain (pain score 1-3).   ketoconazole 2 % shampoo Commonly known as: NIZORAL Apply 1 Application topically 2 (two) times a week.   levothyroxine 50 MCG tablet Commonly known as: SYNTHROID Take 50 mcg by mouth daily.   methocarbamol 500 MG tablet Commonly known as: ROBAXIN Take 1 tablet (500 mg total) by mouth every 6 (six) hours as needed for muscle spasms.   metoprolol succinate 25 MG 24 hr tablet Commonly known as: TOPROL-XL Take 25 mg by mouth at bedtime.   multivitamin with minerals tablet Take 1 tablet by mouth daily.   nitroGLYCERIN 0.4 MG SL tablet Commonly known as: NITROSTAT Place 0.4 mg under the tongue.   omeprazole 20 MG capsule Commonly known as: PRILOSEC Take 20 mg by mouth daily.   oxyCODONE 5 MG immediate release tablet Commonly known as: Oxy IR/ROXICODONE Take 0.5 tablets (2.5 mg total) by mouth every 4 (four) hours as needed for severe pain (pain score 7-10).   oxyCODONE-acetaminophen 5-325 MG tablet Commonly known as: PERCOCET/ROXICET Take 1 tablet by mouth every 4 (four) hours as needed for severe pain (pain score 7-10).   rosuvastatin  5 MG tablet Commonly known as: CRESTOR  Take 5 mg by mouth every Monday, Wednesday, and Friday.       Allergies  Allergen Reactions   Valium Other (See Comments)    STOPS HEART!   Lipitor [Atorvastatin] Other (See Comments)    Body aches   Penicillins Other (See Comments)    Reaction from military injection    Follow-up Information     Connect with your PCP/Specialist as discussed. Schedule an appointment as soon as possible for a visit .   Contact information: [email or web address] Call our physician referral line at [phone].        Maczis, Puja Gosai, PA-C Follow up.    Specialty: General Surgery Why: Call to confirm your appointment date and time, bring a copy of your photo ID and insurance card, arrive 30 minutes prior to your appointment Contact information: [address] [place] [address] [phone]                  The results of significant diagnostics from this hospitalization (including imaging, microbiology, ancillary and laboratory) are listed below for reference.    Significant Diagnostic Studies: US  Abdomen Limited RUQ (LIVER/GB) Result Date: 09/03/2023 CLINICAL DATA:  8 hours history epigastric pain, nausea. Gallstones noted on CT today. EXAM: ULTRASOUND ABDOMEN LIMITED RIGHT UPPER QUADRANT COMPARISON:  CT with IV contrast earlier today. FINDINGS: Gallbladder: There are stones and sludge in the gallbladder, largest visible stone 5 mm. There is a thickened free wall measuring 6.8 mm, trace pericholecystic fluid. There is no positive sonographic Murphy sign, but the patient has received pain medication and the Murphy's sign could be suppressed. Common  bile duct: Diameter: 3.4 mm.  No intrahepatic bile duct dilatation. Liver: No focal lesion identified. Within normal limits in parenchymal echogenicity. Portal vein is patent on color Doppler imaging with normal direction of blood flow towards the liver. Other: None. IMPRESSION: 1. Cholelithiasis with thickened free wall and trace pericholecystic fluid. No positive sonographic Murphy sign, but the patient has received pain medication and the Murphy's sign could be suppressed. Other findings are suspicious for cholecystitis. 2. No biliary ductal dilatation. Electronically Signed   By: Denman Fischer M.D.   On: 09/03/2023 06:31   CT ABDOMEN PELVIS W CONTRAST Result Date: 09/03/2023 EXAM: CT ABDOMEN AND PELVIS WITH CONTRAST 09/03/2023 01:00:00 AM TECHNIQUE: CT of the abdomen and pelvis was performed with the administration of 100mL iohexol (OMNIPAQUE) 300 MG/ML solution. Multiplanar  reformatted images are provided for review. Automated exposure control, iterative reconstruction, and/or weight based adjustment of the mA/kV was utilized to reduce the radiation dose to as low as reasonably achievable. COMPARISON: CTA abdomen / pelvis dated 11/14/2003. CLINICAL HISTORY: Abdominal pain, acute, nonlocalized. Pt c/o right sided abd with some nausea and emesis x 1 starting 2 hours ago, pt reports gall stone on a MRI about a year ago, denies fever, diarrhea, urinary symptoms. FINDINGS: LOWER CHEST: Median sternotomy. LIVER: Subcentimeter cyst in segment 4a (image 12). GALLBLADDER AND BILE DUCTS: Small gallstones, without associated inflammatory changes. SPLEEN: No acute abnormality. PANCREAS: No acute abnormality. ADRENAL GLANDS: No acute abnormality. KIDNEYS, URETERS AND BLADDER: No stones in the kidneys or ureters. No hydronephrosis. No perinephric or periureteral stranding. Urinary bladder is unremarkable. GI AND BOWEL: The appendix is not completely visualized, likely surgically absent. No bowel obstruction. PERITONEUM AND RETROPERITONEUM: No ascites. No free air. VASCULATURE: Atherosclerotic calcifications of the abdominal aorta and branch vessels. LYMPH NODES: No lymphadenopathy. REPRODUCTIVE ORGANS: No acute abnormality. BONES AND SOFT TISSUES: Small fat-containing right inguinal hernia. Degenerative changes of the visualized thoracolumbar spine. IMPRESSION: 1. Small gallstones without associated inflammatory changes. 2. Small fat-containing right inguinal hernia. Electronically signed by: Zadie Herter MD 09/03/2023 01:11 AM EDT RP Workstation: [id number]    Microbiology: No results found for this or any previous visit (from the past 240 hours).   Labs: Basic Metabolic Panel: Recent Labs  Lab 09/02/23 2354 09/03/23 1114 09/04/23 0417 09/06/23 0442 09/07/23 0450  NA 140 137 136 135 135  K 3.9 3.8 4.4 4.6 4.7  CL 102 106 104 103 103  CO2 23 22 24 22 24   GLUCOSE 167* 99 104*  87 131*  BUN 18 15 10 11 9   CREATININE 1.42* 0.99 1.19 1.12 1.07  CALCIUM  9.3 8.6* 8.4* 8.4* 8.9  MG  --  2.0  --   --   --    Liver Function Tests: Recent Labs  Lab 09/02/23 2354 09/03/23 1114 09/04/23 0417  AST 20 22 18   ALT 12 13 11   ALKPHOS 79 57 52  BILITOT 0.3 0.9 1.0  PROT 7.3 6.1* 5.7*  ALBUMIN 4.4 3.4* 3.2*   Recent Labs  Lab 09/02/23 2354  LIPASE 34   No results for input(s): "AMMONIA" in the last 168 hours. CBC: Recent Labs  Lab 09/02/23 2354 09/03/23 1114 09/04/23 0417 09/06/23 0442 09/07/23 0450  WBC 9.7 9.3 7.3 7.6 11.6*  NEUTROABS 5.9  --   --   --  10.4*  HGB 14.5 13.5 12.7* 13.9 14.3  HCT 42.8 41.9 40.7 43.4 44.1  MCV 94.9 99.5 101.2* 100.9* 98.4  PLT 330 274 217 234 315   Cardiac  Enzymes: No results for input(s): "CKTOTAL", "CKMB", "CKMBINDEX", "TROPONINI" in the last 168 hours. BNP: BNP (last 3 results) No results for input(s): "BNP" in the last 8760 hours.  ProBNP (last 3 results) No results for input(s): "PROBNP" in the last 8760 hours.  CBG: No results for input(s): "GLUCAP" in the last 168 hours.  Signed:  Jai-Gurmukh Trine Fread MD   [facility] 09/07/2023, 8:39 AM

## 2023-09-07 NOTE — Progress Notes (Signed)
Pt was discharged home today. Instructions were reviewed with patient, and questions were answered. Pt was taken to main entrance via wheelchair by NT.
